# Patient Record
Sex: Male | Born: 1960 | Hispanic: Refuse to answer | Smoking: Current every day smoker
Health system: Southern US, Community
[De-identification: ages and names within clinical notes are randomized; demographics above are authoritative.]

## PROBLEM LIST (undated history)

## (undated) DIAGNOSIS — E119 Type 2 diabetes mellitus without complications: Secondary | ICD-10-CM

## (undated) DIAGNOSIS — I10 Essential (primary) hypertension: Secondary | ICD-10-CM

## (undated) DIAGNOSIS — N183 Chronic kidney disease, stage 3 unspecified: Secondary | ICD-10-CM

## (undated) DIAGNOSIS — F32A Depression, unspecified: Secondary | ICD-10-CM

## (undated) DIAGNOSIS — F329 Major depressive disorder, single episode, unspecified: Secondary | ICD-10-CM

## (undated) HISTORY — DX: Chronic kidney disease, stage 3 unspecified: N18.30

## (undated) HISTORY — DX: Chronic kidney disease, stage 3 (moderate): N18.3

---

## 2006-08-09 ENCOUNTER — Ambulatory Visit: Payer: Self-pay | Admitting: Nurse Practitioner

## 2006-09-27 ENCOUNTER — Ambulatory Visit: Payer: Self-pay | Admitting: Nurse Practitioner

## 2007-07-02 ENCOUNTER — Encounter (INDEPENDENT_AMBULATORY_CARE_PROVIDER_SITE_OTHER): Payer: Self-pay | Admitting: *Deleted

## 2007-07-14 ENCOUNTER — Ambulatory Visit: Payer: Self-pay | Admitting: Internal Medicine

## 2007-07-23 ENCOUNTER — Ambulatory Visit: Payer: Self-pay | Admitting: Nurse Practitioner

## 2007-08-19 ENCOUNTER — Emergency Department (HOSPITAL_COMMUNITY): Admission: EM | Admit: 2007-08-19 | Discharge: 2007-08-19 | Payer: Self-pay | Admitting: Emergency Medicine

## 2008-01-05 ENCOUNTER — Ambulatory Visit: Payer: Self-pay | Admitting: Internal Medicine

## 2008-03-27 ENCOUNTER — Emergency Department (HOSPITAL_COMMUNITY): Admission: EM | Admit: 2008-03-27 | Discharge: 2008-03-27 | Payer: Self-pay | Admitting: Emergency Medicine

## 2008-03-29 ENCOUNTER — Ambulatory Visit: Payer: Self-pay | Admitting: *Deleted

## 2008-09-03 ENCOUNTER — Emergency Department (HOSPITAL_COMMUNITY): Admission: EM | Admit: 2008-09-03 | Discharge: 2008-09-03 | Payer: Self-pay | Admitting: Emergency Medicine

## 2008-09-28 ENCOUNTER — Emergency Department (HOSPITAL_COMMUNITY): Admission: EM | Admit: 2008-09-28 | Discharge: 2008-09-28 | Payer: Self-pay | Admitting: Emergency Medicine

## 2008-10-11 ENCOUNTER — Emergency Department (HOSPITAL_COMMUNITY): Admission: EM | Admit: 2008-10-11 | Discharge: 2008-10-11 | Payer: Self-pay | Admitting: Emergency Medicine

## 2008-11-01 ENCOUNTER — Emergency Department (HOSPITAL_COMMUNITY): Admission: EM | Admit: 2008-11-01 | Discharge: 2008-11-01 | Payer: Self-pay | Admitting: Family Medicine

## 2008-11-01 ENCOUNTER — Emergency Department (HOSPITAL_COMMUNITY): Admission: EM | Admit: 2008-11-01 | Discharge: 2008-11-01 | Payer: Self-pay | Admitting: Emergency Medicine

## 2008-11-03 ENCOUNTER — Emergency Department (HOSPITAL_COMMUNITY): Admission: EM | Admit: 2008-11-03 | Discharge: 2008-11-03 | Payer: Self-pay | Admitting: Emergency Medicine

## 2008-12-01 ENCOUNTER — Emergency Department (HOSPITAL_COMMUNITY): Admission: EM | Admit: 2008-12-01 | Discharge: 2008-12-01 | Payer: Self-pay | Admitting: Emergency Medicine

## 2008-12-20 ENCOUNTER — Emergency Department (HOSPITAL_COMMUNITY): Admission: EM | Admit: 2008-12-20 | Discharge: 2008-12-20 | Payer: Self-pay | Admitting: Emergency Medicine

## 2009-01-04 ENCOUNTER — Emergency Department (HOSPITAL_COMMUNITY): Admission: EM | Admit: 2009-01-04 | Discharge: 2009-01-04 | Payer: Self-pay | Admitting: Emergency Medicine

## 2009-01-06 ENCOUNTER — Ambulatory Visit: Payer: Self-pay | Admitting: Internal Medicine

## 2009-01-06 ENCOUNTER — Encounter (INDEPENDENT_AMBULATORY_CARE_PROVIDER_SITE_OTHER): Payer: Self-pay | Admitting: Internal Medicine

## 2009-01-06 LAB — CONVERTED CEMR LAB
Barbiturate Quant, Ur: NEGATIVE
Benzodiazepines.: NEGATIVE
HCT: 47.8 % (ref 39.0–52.0)
Lymphocytes Relative: 28 % (ref 12–46)
MCV: 80.5 fL (ref 78.0–100.0)
Methadone: NEGATIVE
Microalb, Ur: 11.2 mg/dL — ABNORMAL HIGH (ref 0.00–1.89)
Monocytes Absolute: 0.7 10*3/uL (ref 0.1–1.0)
Neutro Abs: 5.3 10*3/uL (ref 1.7–7.7)
Neutrophils Relative %: 61 % (ref 43–77)
Platelets: 179 10*3/uL (ref 150–400)
Prolactin: 4.3 ng/mL (ref 2.1–17.1)
Propoxyphene: NEGATIVE
RDW: 14.3 % (ref 11.5–15.5)

## 2009-02-08 ENCOUNTER — Emergency Department (HOSPITAL_COMMUNITY): Admission: EM | Admit: 2009-02-08 | Discharge: 2009-02-09 | Payer: Self-pay | Admitting: *Deleted

## 2009-03-04 ENCOUNTER — Ambulatory Visit: Payer: Self-pay | Admitting: Family Medicine

## 2009-06-06 ENCOUNTER — Ambulatory Visit: Payer: Self-pay | Admitting: Family Medicine

## 2009-08-11 ENCOUNTER — Ambulatory Visit: Payer: Self-pay | Admitting: Internal Medicine

## 2009-12-08 ENCOUNTER — Emergency Department (HOSPITAL_COMMUNITY): Admission: EM | Admit: 2009-12-08 | Discharge: 2009-12-08 | Payer: Self-pay | Admitting: Emergency Medicine

## 2010-04-09 ENCOUNTER — Emergency Department (HOSPITAL_COMMUNITY): Admission: EM | Admit: 2010-04-09 | Discharge: 2010-04-09 | Payer: Self-pay | Admitting: Emergency Medicine

## 2010-12-31 LAB — DIFFERENTIAL
Basophils Absolute: 0.1 10*3/uL (ref 0.0–0.1)
Basophils Relative: 1 % (ref 0–1)
Lymphocytes Relative: 30 % (ref 12–46)
Lymphs Abs: 2.4 10*3/uL (ref 0.7–4.0)
Monocytes Absolute: 0.6 10*3/uL (ref 0.1–1.0)
Neutro Abs: 4.7 10*3/uL (ref 1.7–7.7)
Neutrophils Relative %: 59 % (ref 43–77)

## 2010-12-31 LAB — BASIC METABOLIC PANEL
Calcium: 8.8 mg/dL (ref 8.4–10.5)
GFR calc Af Amer: >60 mL/min (ref >60)
GFR calc non Af Amer: >60 mL/min (ref >60)
Potassium: 3.8 mEq/L (ref 3.5–5.1)
Sodium: 137 mEq/L (ref 135–145)

## 2010-12-31 LAB — CBC
HCT: 41.1 % (ref 39.0–52.0)
Hemoglobin: 13.6 g/dL (ref 13.0–17.0)
MCH: 26.4 pg (ref 26.0–34.0)
MCV: 79.8 fL (ref 78.0–100.0)
Platelets: 155 10*3/uL (ref 150–400)
RBC: 5.16 MIL/uL (ref 4.22–5.81)
WBC: 8 10*3/uL (ref 4.0–10.5)

## 2011-07-18 ENCOUNTER — Emergency Department (HOSPITAL_COMMUNITY)
Admission: EM | Admit: 2011-07-18 | Discharge: 2011-07-19 | Disposition: A | Discharge status: Other Acute Inpatient Hospital | Payer: Medicare Other | Attending: Emergency Medicine | Admitting: Emergency Medicine

## 2011-07-18 DIAGNOSIS — Z9119 Patient's noncompliance with other medical treatment and regimen: Secondary | ICD-10-CM | POA: Insufficient documentation

## 2011-07-18 DIAGNOSIS — R45851 Suicidal ideations: Secondary | ICD-10-CM | POA: Insufficient documentation

## 2011-07-18 DIAGNOSIS — F329 Major depressive disorder, single episode, unspecified: Secondary | ICD-10-CM | POA: Insufficient documentation

## 2011-07-18 DIAGNOSIS — I10 Essential (primary) hypertension: Secondary | ICD-10-CM | POA: Insufficient documentation

## 2011-07-18 DIAGNOSIS — Z91199 Patient's noncompliance with other medical treatment and regimen due to unspecified reason: Secondary | ICD-10-CM | POA: Insufficient documentation

## 2011-07-18 DIAGNOSIS — E1169 Type 2 diabetes mellitus with other specified complication: Secondary | ICD-10-CM | POA: Insufficient documentation

## 2011-07-18 DIAGNOSIS — F3289 Other specified depressive episodes: Secondary | ICD-10-CM | POA: Insufficient documentation

## 2011-07-18 LAB — URINALYSIS, ROUTINE W REFLEX MICROSCOPIC
Bilirubin Urine: NEGATIVE
Ketones, ur: NEGATIVE mg/dL
Leukocytes, UA: NEGATIVE
Nitrite: NEGATIVE
Protein, ur: >300 mg/dL — AB
pH: 6 (ref 5.0–8.0)

## 2011-07-18 LAB — DIFFERENTIAL
Basophils Absolute: 0 10*3/uL (ref 0.0–0.1)
Eosinophils Absolute: 0 10*3/uL (ref 0.0–0.7)
Eosinophils Relative: 0 % (ref 0–5)
Lymphocytes Relative: 11 % — ABNORMAL LOW (ref 12–46)
Monocytes Absolute: 0.8 10*3/uL (ref 0.1–1.0)
Neutro Abs: 11.5 10*3/uL — ABNORMAL HIGH (ref 1.7–7.7)

## 2011-07-18 LAB — GLUCOSE, CAPILLARY: Glucose-Capillary: 283 mg/dL — ABNORMAL HIGH (ref 70–99)

## 2011-07-18 LAB — POCT I-STAT, CHEM 8
Glucose, Bld: 273 — ABNORMAL HIGH
HCT: 52
Hemoglobin: 17.7 — ABNORMAL HIGH

## 2011-07-18 LAB — HEPATIC FUNCTION PANEL
ALT: 9 U/L (ref 0–53)
Alkaline Phosphatase: 86 U/L (ref 39–117)

## 2011-07-18 LAB — POCT CARDIAC MARKERS: CKMB, poc: <1 — ABNORMAL LOW

## 2011-07-18 LAB — CBC
MCH: 25.7 pg — ABNORMAL LOW (ref 26.0–34.0)
MCHC: 33.7 g/dL (ref 30.0–36.0)
MCV: 76.4 fL — ABNORMAL LOW (ref 78.0–100.0)
Platelets: 208 10*3/uL (ref 150–400)
WBC: 13.8 10*3/uL — ABNORMAL HIGH (ref 4.0–10.5)

## 2011-07-18 LAB — URINE MICROSCOPIC-ADD ON

## 2011-07-18 LAB — BASIC METABOLIC PANEL
CO2: 29 mEq/L (ref 19–32)
Calcium: 8.9 mg/dL (ref 8.4–10.5)
GFR calc non Af Amer: >90 mL/min (ref >90)
Glucose, Bld: 330 mg/dL — ABNORMAL HIGH (ref 70–99)

## 2011-07-18 LAB — RAPID URINE DRUG SCREEN, HOSP PERFORMED
Amphetamines: NOT DETECTED
Tetrahydrocannabinol: POSITIVE — AB

## 2011-07-18 LAB — ACETAMINOPHEN LEVEL: Acetaminophen (Tylenol), Serum: <15 ug/mL (ref 10–30)

## 2011-07-18 LAB — ETHANOL: Alcohol, Ethyl (B): <11 mg/dL (ref 0–11)

## 2011-07-19 LAB — GLUCOSE, CAPILLARY

## 2012-02-18 ENCOUNTER — Emergency Department (HOSPITAL_COMMUNITY)
Admission: EM | Admit: 2012-02-18 | Discharge: 2012-02-19 | Disposition: A | Discharge status: Other Acute Inpatient Hospital | Payer: Medicare Other | Source: Home / Self Care | Attending: Emergency Medicine | Admitting: Emergency Medicine

## 2012-02-18 ENCOUNTER — Encounter (HOSPITAL_COMMUNITY): Payer: Self-pay | Admitting: Adult Health

## 2012-02-18 DIAGNOSIS — R45851 Suicidal ideations: Secondary | ICD-10-CM

## 2012-02-18 DIAGNOSIS — F329 Major depressive disorder, single episode, unspecified: Secondary | ICD-10-CM

## 2012-02-18 DIAGNOSIS — F1910 Other psychoactive substance abuse, uncomplicated: Secondary | ICD-10-CM

## 2012-02-18 DIAGNOSIS — F17200 Nicotine dependence, unspecified, uncomplicated: Secondary | ICD-10-CM | POA: Insufficient documentation

## 2012-02-18 DIAGNOSIS — I10 Essential (primary) hypertension: Secondary | ICD-10-CM | POA: Insufficient documentation

## 2012-02-18 DIAGNOSIS — Z79899 Other long term (current) drug therapy: Secondary | ICD-10-CM | POA: Insufficient documentation

## 2012-02-18 DIAGNOSIS — F3289 Other specified depressive episodes: Secondary | ICD-10-CM | POA: Insufficient documentation

## 2012-02-18 HISTORY — DX: Major depressive disorder, single episode, unspecified: F32.9

## 2012-02-18 HISTORY — DX: Depression, unspecified: F32.A

## 2012-02-18 HISTORY — DX: Essential (primary) hypertension: I10

## 2012-02-18 LAB — CBC
Hemoglobin: 11.3 g/dL — ABNORMAL LOW (ref 13.0–17.0)
MCH: 26 pg (ref 26.0–34.0)
MCV: 78.8 fL (ref 78.0–100.0)
RBC: 4.34 MIL/uL (ref 4.22–5.81)
WBC: 10.7 10*3/uL — ABNORMAL HIGH (ref 4.0–10.5)

## 2012-02-18 LAB — RAPID URINE DRUG SCREEN, HOSP PERFORMED
Amphetamines: NOT DETECTED
Barbiturates: NOT DETECTED
Opiates: NOT DETECTED
Tetrahydrocannabinol: NOT DETECTED

## 2012-02-18 LAB — COMPREHENSIVE METABOLIC PANEL
ALT: 10 U/L (ref 0–53)
AST: 12 U/L (ref 0–37)
CO2: 30 mEq/L (ref 19–32)
Calcium: 9.3 mg/dL (ref 8.4–10.5)
Chloride: 101 mEq/L (ref 96–112)
GFR calc Af Amer: 72 mL/min — ABNORMAL LOW (ref >90)
GFR calc non Af Amer: 62 mL/min — ABNORMAL LOW (ref >90)
Glucose, Bld: 234 mg/dL — ABNORMAL HIGH (ref 70–99)
Sodium: 138 mEq/L (ref 135–145)
Total Bilirubin: 0.1 mg/dL — ABNORMAL LOW (ref 0.3–1.2)

## 2012-02-18 LAB — GLUCOSE, CAPILLARY: Glucose-Capillary: 252 mg/dL — ABNORMAL HIGH (ref 70–99)

## 2012-02-18 MED ORDER — ZOLPIDEM TARTRATE 5 MG PO TABS
5.0000 mg | ORAL_TABLET | Freq: Every evening | ORAL | Status: DC | PRN
  Administered 2012-02-18: 5 mg via ORAL
  Filled 2012-02-18: qty 1

## 2012-02-18 MED ORDER — ALUM & MAG HYDROXIDE-SIMETH 200-200-20 MG/5ML PO SUSP: 30.0000 mL | ORAL | Status: DC | PRN

## 2012-02-18 MED ORDER — ONDANSETRON HCL 8 MG PO TABS: 4.0000 mg | ORAL_TABLET | Freq: Three times a day (TID) | ORAL | Status: DC | PRN

## 2012-02-18 MED ORDER — METFORMIN HCL 500 MG PO TABS
500.0000 mg | ORAL_TABLET | Freq: Two times a day (BID) | ORAL | Status: DC
  Administered 2012-02-18 – 2012-02-19 (×2): 500 mg via ORAL
  Filled 2012-02-18 (×4): qty 1

## 2012-02-18 NOTE — ED Notes (Signed)
House coverage notified of SI pt and Charge RN notified

## 2012-02-18 NOTE — ED Notes (Signed)
CBG 252 

## 2012-02-18 NOTE — ED Notes (Signed)
ACT team at bedside.  
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
## 2012-02-18 NOTE — ED Provider Notes (Signed)
History     CSN: [id number]  Arrival date & time 02/18/12  1549   First MD Initiated Contact with Patient 02/18/12 1609      Chief Complaint  Patient presents with  . Medical Clearance    (Consider location/radiation/quality/duration/timing/severity/associated sxs/prior treatment) HPI Pt with history of depression and substance abuse reports 2 weeks of worsening depression and suicidal ideation with plan to overdose or jump in front of a car. He admits to occasional EtOH and crack cocaine use. Denies any overdose on his medications. He has chronic leg pain from neuropathy, but no acute somatic symptoms. No CP, SOB, N/V/D.   Past Medical History  Diagnosis Date  . Depression   . Hypertension     History reviewed. No pertinent past surgical history.  History reviewed. No pertinent family history.  History  Substance Use Topics  . Smoking status: Current Everyday Smoker  . Smokeless tobacco: Not on file  . Alcohol Use: Yes      Review of Systems All other systems reviewed and are negative except as noted in HPI.   Allergies  Review of patient's allergies indicates no known allergies.  Home Medications   Current Outpatient Rx  Name Route Sig Dispense Refill  . AMLODIPINE BESYLATE 10 MG PO TABS Oral Take 10 mg by mouth daily.    Marland Kitchen FLUOXETINE HCL 20 MG PO TABS Oral Take 60 mg by mouth every morning.    Marland Kitchen GABAPENTIN 400 MG PO CAPS Oral Take 400 mg by mouth 3 (three) times daily.    Marland Kitchen HYDROCHLOROTHIAZIDE 25 MG PO TABS Oral Take 25 mg by mouth every morning.    Marland Kitchen METFORMIN HCL 500 MG PO TABS Oral Take 500 mg by mouth 2 (two) times daily with a meal.    . OXYCODONE HCL ER 30 MG PO TB12 Oral Take 30 mg by mouth every 12 (twelve) hours.    Marland Kitchen QUETIAPINE FUMARATE 300 MG PO TABS Oral Take 300 mg by mouth at bedtime.    Marland Kitchen VITAMIN D (ERGOCALCIFEROL) 50000 UNITS PO CAPS Oral Take 50,000 Units by mouth every 7 (seven) days. Takes on Tuesdays.    Marland Kitchen ZOLPIDEM TARTRATE 10 MG PO TABS  Oral Take 10 mg by mouth at bedtime as needed. For sleep.      BP 162/96  Pulse 84  Temp(Src) 98.7 F (37.1 C) (Oral)  Resp 16  SpO2 95%  Physical Exam  Nursing note and vitals reviewed. Constitutional: He is oriented to person, place, and time. He appears well-developed and well-nourished.  HENT:  Head: Normocephalic and atraumatic.  Eyes: EOM are normal. Pupils are equal, round, and reactive to light.  Neck: Normal range of motion. Neck supple.  Cardiovascular: Normal rate, normal heart sounds and intact distal pulses.   Pulmonary/Chest: Effort normal and breath sounds normal.  Abdominal: Bowel sounds are normal. He exhibits no distension. There is no tenderness.  Musculoskeletal: Normal range of motion. He exhibits no edema and no tenderness.  Neurological: He is alert and oriented to person, place, and time. He has normal strength. No cranial nerve deficit or sensory deficit.  Skin: Skin is warm and dry. No rash noted.  Psychiatric:       Depressed mood, flat affect    ED Course  Procedures (including critical care time)   Labs Reviewed  CBC  COMPREHENSIVE METABOLIC PANEL  ETHANOL  ACETAMINOPHEN LEVEL  URINE RAPID DRUG SCREEN (HOSP PERFORMED)   No results found.   No diagnosis found.  MDM  Plan lab eval for medical clearance and then ACT team referral for placement.    PT seen by ACT, anticipate [facility] placement. Metformin ordered for hyperglycemia, although patient is unsure of his dose. Care signed out to Dr. Zenia Resides at the change of shift.   Kameryn Davern B. Karle Starch, MD 02/18/12 2310

## 2012-02-18 NOTE — ED Notes (Signed)
Began feeling "like I want to be off this earth and not even be in it. I have been here before. I did have a plan to kill myself with medicines that I take. I found a friend dead in the bathroom and I have been kind of down since then. I also have a crack problem" Last time used was 3 days ago, last drink three days ago.

## 2012-02-18 NOTE — ED Notes (Signed)
Pt wanded by security. Has 4 belonging bags and one khaki coat with a hood. $13.00 and 2 credit cards.

## 2012-02-19 ENCOUNTER — Inpatient Hospital Stay (HOSPITAL_COMMUNITY)
Admission: EM | Admit: 2012-02-19 | Discharge: 2012-02-25 | DRG: 885 | Disposition: A | Discharge status: Home / Self Care | Payer: Medicare Other | Attending: Psychiatry | Admitting: Psychiatry

## 2012-02-19 DIAGNOSIS — F251 Schizoaffective disorder, depressive type: Secondary | ICD-10-CM | POA: Diagnosis present

## 2012-02-19 DIAGNOSIS — F259 Schizoaffective disorder, unspecified: Principal | ICD-10-CM | POA: Diagnosis present

## 2012-02-19 DIAGNOSIS — F1210 Cannabis abuse, uncomplicated: Secondary | ICD-10-CM | POA: Diagnosis present

## 2012-02-19 DIAGNOSIS — F1420 Cocaine dependence, uncomplicated: Secondary | ICD-10-CM | POA: Diagnosis present

## 2012-02-19 DIAGNOSIS — Z794 Long term (current) use of insulin: Secondary | ICD-10-CM

## 2012-02-19 DIAGNOSIS — I10 Essential (primary) hypertension: Secondary | ICD-10-CM | POA: Diagnosis present

## 2012-02-19 DIAGNOSIS — F17200 Nicotine dependence, unspecified, uncomplicated: Secondary | ICD-10-CM | POA: Diagnosis present

## 2012-02-19 DIAGNOSIS — R45851 Suicidal ideations: Secondary | ICD-10-CM

## 2012-02-19 DIAGNOSIS — Z79899 Other long term (current) drug therapy: Secondary | ICD-10-CM

## 2012-02-19 MED ORDER — ACETAMINOPHEN 325 MG PO TABS: 650.0000 mg | ORAL_TABLET | Freq: Four times a day (QID) | ORAL | Status: DC | PRN

## 2012-02-19 MED ORDER — CLONIDINE HCL 0.1 MG PO TABS
0.1000 mg | ORAL_TABLET | Freq: Once | ORAL | Status: AC
  Administered 2012-02-19: 0.1 mg via ORAL
  Filled 2012-02-19: qty 1

## 2012-02-19 MED ORDER — MAGNESIUM HYDROXIDE 400 MG/5ML PO SUSP: 30.0000 mL | Freq: Every day | ORAL | Status: DC | PRN

## 2012-02-19 MED ORDER — IBUPROFEN 800 MG PO TABS
800.0000 mg | ORAL_TABLET | Freq: Once | ORAL | Status: AC
  Administered 2012-02-19: 800 mg via ORAL
  Filled 2012-02-19: qty 1

## 2012-02-19 MED ORDER — ALUM & MAG HYDROXIDE-SIMETH 200-200-20 MG/5ML PO SUSP: 30.0000 mL | ORAL | Status: DC | PRN

## 2012-02-19 MED ORDER — HYDROCHLOROTHIAZIDE 25 MG PO TABS
25.0000 mg | ORAL_TABLET | ORAL | Status: AC
  Administered 2012-02-19: 25 mg via ORAL
  Filled 2012-02-19: qty 1

## 2012-02-19 NOTE — ED Notes (Signed)
Note sent to pharmacy to send patient's HCTZ.

## 2012-02-19 NOTE — ED Notes (Signed)
Patient's belongings were given to Security

## 2012-02-19 NOTE — ED Notes (Signed)
Pt was escorted by [employer] and Waunita Schooner EMT to [facility]

## 2012-02-19 NOTE — ED Provider Notes (Signed)
The patient is here requesting treatment for substance abuse problems.  Filed Vitals:   02/19/12 0552  BP: 136/81  Pulse: 71  Temp: 98.4 F (36.9 C)  Resp: 18   Heart: Regular in rhythm Lungs: Clear to auscultation  The patient's blood pressure has improved. Her health wanted to make sure that he had a stable blood pressure before he would be admitted at the facility. We will continue to monitor  Kathalene Frames, MD 02/19/12 [phone]

## 2012-02-19 NOTE — BH Assessment (Signed)
Assessment Note  Update:  Received call from [facility] stating pt accepted to [facility] to Dr. Pollie Friar by Dr. Dell Ponto to bed 304-2.  Updated EDP Tomi Bamberger and ED staff.  Completed support paperwork.  Updated assessment disposition, completed assessment notification and faxed to [facility] to log.  ED staff to arrange transport to [facility] via security, as pt is voluntary. Disposition:  Disposition Disposition of Patient: Inpatient treatment program Type of inpatient treatment program: Adult (Pt accepted [facility])  On Site Evaluation by:   Reviewed with Physician:  Audry Riles, Lorayne Bender 02/19/2012 1:21 PM

## 2012-02-19 NOTE — Progress Notes (Signed)
Patient ID: Jose Conway, male   DOB: 04/23/1961, 51 y.o.   MRN: [id number] Pt states he has been suffering from depression for the last 20 years. Pt stated that he just feel he is not where he needs to be in life and has not accomplished what he thought he would have by now. Pt states he has no kids and is separated from his wife. Pt rates depression at a 10 and anxiety at a 8. Pt states when his depression gets high that is when he starts having suicidal thoughts. Pt states he is currently having suicidal ideations but is able to contract. Pt would also like medication management feels that what he is on is not working. Pt would also like to go from here into a long term treatment facility. Pt is pleasant and cooperative.

## 2012-02-19 NOTE — Progress Notes (Signed)
IN room preparing for bed on approach. Has been visible in milieu but is generally withdrawn to self. Appears flat and depressed. Calm and cooperative with assessment. No acute distress noted. States he feels like he is adjusting to milieu. Has attended groups and said they were "OK". Asked Probation officer for something for sleep. However, there were no scheduled or prn meds available. Encouraged to attempt to lay down and see if he can get to sleep naturally. If after an hour he is still awake, Probation officer assured him he would call the on call practitioner to see if he could have anything. Pt agreed to this and states he will try. Support and encouragement provided. Offered no additional questions or concerns. POC for the shift reviewed and understanding verbalized. Safety has been maintained with Q15 minute observation. Will continue current POC.

## 2012-02-19 NOTE — Tx Team (Signed)
Initial Interdisciplinary Treatment Plan  PATIENT STRENGTHS: (choose at least two) Ability for insight Average or above average intelligence Capable of independent living General fund of knowledge  PATIENT STRESSORS: Health problems   PROBLEM LIST: Problem List/Patient Goals Date to be addressed Date deferred Reason deferred Estimated date of resolution  Depression 02/19/12     Anxiety 02/19/12     Suicidal Ideation 02/19/12     Medication Management 02/19/12                                    DISCHARGE CRITERIA:  Ability to meet basic life and health needs Improved stabilization in mood, thinking, and/or behavior Motivation to continue treatment in a less acute level of care Reduction of life-threatening or endangering symptoms to within safe limits  PRELIMINARY DISCHARGE PLAN: Attend aftercare/continuing care group Outpatient therapy Return to previous living arrangement  PATIENT/FAMIILY INVOLVEMENT: This treatment plan has been presented to and reviewed with the patient, Jose Conway, and/or family member.  The patient and family have been given the opportunity to ask questions and make suggestions.  Beverly Sessions Violon 02/19/2012, 8:10 PM

## 2012-02-20 DIAGNOSIS — F251 Schizoaffective disorder, depressive type: Secondary | ICD-10-CM | POA: Diagnosis present

## 2012-02-20 DIAGNOSIS — F259 Schizoaffective disorder, unspecified: Principal | ICD-10-CM

## 2012-02-20 DIAGNOSIS — F1420 Cocaine dependence, uncomplicated: Secondary | ICD-10-CM | POA: Diagnosis present

## 2012-02-20 LAB — GLUCOSE, CAPILLARY

## 2012-02-20 MED ORDER — PRAZOSIN HCL 1 MG PO CAPS
1.0000 mg | ORAL_CAPSULE | Freq: Every evening | ORAL | Status: DC | PRN
  Administered 2012-02-20 (×2): 1 mg via ORAL
  Filled 2012-02-20 (×5): qty 1

## 2012-02-20 MED ORDER — GABAPENTIN 400 MG PO CAPS
400.0000 mg | ORAL_CAPSULE | Freq: Three times a day (TID) | ORAL | Status: DC
  Administered 2012-02-20: 400 mg via ORAL
  Filled 2012-02-20 (×6): qty 1

## 2012-02-20 MED ORDER — GABAPENTIN 300 MG PO CAPS
600.0000 mg | ORAL_CAPSULE | Freq: Four times a day (QID) | ORAL | Status: DC
  Administered 2012-02-20 – 2012-02-25 (×19): 600 mg via ORAL
  Filled 2012-02-20 (×26): qty 2

## 2012-02-20 MED ORDER — AMLODIPINE BESYLATE 10 MG PO TABS: 10.0000 mg | ORAL_TABLET | Freq: Every day | ORAL | Status: DC

## 2012-02-20 MED ORDER — AMLODIPINE BESYLATE 10 MG PO TABS
10.0000 mg | ORAL_TABLET | Freq: Every day | ORAL | Status: DC
  Administered 2012-02-20 – 2012-02-25 (×6): 10 mg via ORAL
  Filled 2012-02-20 (×8): qty 1

## 2012-02-20 MED ORDER — METFORMIN HCL 500 MG PO TABS
1000.0000 mg | ORAL_TABLET | Freq: Two times a day (BID) | ORAL | Status: DC
  Administered 2012-02-20 – 2012-02-25 (×10): 1000 mg via ORAL
  Filled 2012-02-20 (×14): qty 2

## 2012-02-20 MED ORDER — VITAMIN D (ERGOCALCIFEROL) 1.25 MG (50000 UNIT) PO CAPS
50000.0000 [IU] | ORAL_CAPSULE | ORAL | Status: DC
  Filled 2012-02-20 (×2): qty 1

## 2012-02-20 MED ORDER — DULOXETINE HCL 60 MG PO CPEP
60.0000 mg | ORAL_CAPSULE | Freq: Every day | ORAL | Status: DC
  Administered 2012-02-22 – 2012-02-25 (×4): 60 mg via ORAL
  Filled 2012-02-20 (×8): qty 1

## 2012-02-20 MED ORDER — INSULIN ASPART 100 UNIT/ML ~~LOC~~ SOLN
4.0000 [IU] | Freq: Three times a day (TID) | SUBCUTANEOUS | Status: DC
  Administered 2012-02-20 – 2012-02-25 (×15): 4 [IU] via SUBCUTANEOUS

## 2012-02-20 MED ORDER — INSULIN ASPART 100 UNIT/ML ~~LOC~~ SOLN: 0.0000 [IU] | Freq: Every day | SUBCUTANEOUS | Status: DC

## 2012-02-20 MED ORDER — DULOXETINE HCL 30 MG PO CPEP
30.0000 mg | ORAL_CAPSULE | Freq: Every day | ORAL | Status: AC
  Administered 2012-02-20 – 2012-02-21 (×2): 30 mg via ORAL
  Filled 2012-02-20 (×2): qty 1

## 2012-02-20 MED ORDER — INSULIN ASPART 100 UNIT/ML ~~LOC~~ SOLN
0.0000 [IU] | Freq: Three times a day (TID) | SUBCUTANEOUS | Status: DC
  Administered 2012-02-20: 8 [IU] via SUBCUTANEOUS
  Administered 2012-02-21: 5 [IU] via SUBCUTANEOUS
  Administered 2012-02-21: 3 [IU] via SUBCUTANEOUS
  Administered 2012-02-21: 5 [IU] via SUBCUTANEOUS
  Administered 2012-02-22 (×2): 3 [IU] via SUBCUTANEOUS
  Administered 2012-02-22: 5 [IU] via SUBCUTANEOUS
  Administered 2012-02-23 (×2): 3 [IU] via SUBCUTANEOUS
  Administered 2012-02-23 – 2012-02-24 (×2): 2 [IU] via SUBCUTANEOUS
  Administered 2012-02-24 (×2): 3 [IU] via SUBCUTANEOUS
  Administered 2012-02-25: 8 [IU] via SUBCUTANEOUS
  Administered 2012-02-25: 2 [IU] via SUBCUTANEOUS

## 2012-02-20 NOTE — Progress Notes (Signed)
[facility] Group Notes: (Counselor/Nursing/MHT/Case Management/Adjunct) 02/20/2012   @  11:00 Emotion Regulation   Type of Therapy:  Group Therapy  Participation Level:  None  Participation Quality: Attentive     Affect:  Blunted  Cognitive:  Appropriate  Insight:  None  Engagement in Group: None  Engagement in Therapy:  None  Modes of Intervention:  Support and Exploration  Summary of Progress/Problems:   Jose Conway did not engage in group activity or discussion.  Jose Conway 02/20/2012 2:25 PM

## 2012-02-20 NOTE — H&P (Signed)
Medical/psychiatric screening examination/treatment/procedure(s) were performed by non-physician practitioner and as supervising physician I was immediately available for consultation/collaboration.  I have seen and examined this patient and agree the major elements of this evaluation.  

## 2012-02-20 NOTE — Progress Notes (Signed)
D:  Pt attended AA group this evening.  He was oriented to unit and scheduling.  A:  Encouraged pt interactions with peers and group attendance.  R:  Pt is safe. Carlye Grippe, Vicent Febles L, MHT/NS

## 2012-02-20 NOTE — Progress Notes (Signed)
[facility] Group Notes: (Counselor/Nursing/MHT/Case Management/Adjunct) 02/20/2012   @1 :15pm Coping with Anxiety:  Using Self-Talk, Breathing Techniques, Muscle Relaxation and Other Tactics  Type of Therapy:  Group Therapy  Participation Level: Limited   Participation Quality:  Attentive  Affect:  Blunted  Cognitive:  Appropriate  Insight:  None  Engagement in Group: Limited  Engagement in Therapy:  None  Modes of Intervention:  Support and Exploration  Summary of Progress/Problems: Jose Conway engaged in breathing and other relaxation techniques, but not in group discussion.    [name] 02/20/2012 4:28 PM

## 2012-02-20 NOTE — Progress Notes (Addendum)
[facility] MD Progress Note  02/20/2012 3:06 PM  Diagnosis:  Axis I: Schizoaffective Disorder and Cocaone Dependence  ADL's:  Intact  Sleep: Poor  Appetite:  Good  Suicidal Ideation:  Pt has some suicidal thoughts, but contracts for safety. Homicidal Ideation:  Denies adamantly any homicidal thoughts.  Mental Status Examination/Evaluation: Objective:  Appearance: Casual  Eye Contact::  Good  Speech:  Clear and Coherent  Volume:  Normal  Mood:  Anxious, Depressed and Worthless  Affect:  Blunt  Thought Process:  Coherent  Orientation:  Full  Thought Content:  Hallucinations: Auditory Visual  Suicidal Thoughts:  Yes.  without intent/plan  Homicidal Thoughts:  No  Memory:  Immediate;   Fair  Judgement:  Impaired  Insight:  Lacking  Psychomotor Activity:  Normal  Concentration:  Fair  Recall:  Fair  Akathisia:  No  Handed:  Right  AIMS (if indicated):     Assets:  Communication Skills Desire for Improvement  Sleep:  Number of Hours: 6    Vital Signs:Blood pressure 128/78, pulse 74, temperature 97.5 F (36.4 C), temperature source Oral, resp. rate 20, height 5\' 10"  (1.778 m), weight 97.07 kg (214 lb). Current Medications: Current Facility-Administered Medications  Medication Dose Route Frequency Provider Last Rate Last Dose  . acetaminophen (TYLENOL) tablet 650 mg  650 mg Oral Q6H PRN Nena Polio, PA-C      . alum & mag hydroxide-simeth (MAALOX/MYLANTA) 200-200-20 MG/5ML suspension 30 mL  30 mL Oral Q4H PRN Nena Polio, PA-C      . amLODipine (NORVASC) tablet 10 mg  10 mg Oral Daily Encarnacion Slates, NP   10 mg at 02/20/12 1408  . DULoxetine (CYMBALTA) DR capsule 30 mg  30 mg Oral Daily Darrol Jump, MD       Followed by  . DULoxetine (CYMBALTA) DR capsule 60 mg  60 mg Oral Daily Darrol Jump, MD      . gabapentin (NEURONTIN) capsule 600 mg  600 mg Oral QID Darrol Jump, MD      . insulin aspart (novoLOG) injection 0-15 Units  0-15 Units Subcutaneous TID WC Encarnacion Slates, NP      . insulin aspart (novoLOG) injection 0-5 Units  0-5 Units Subcutaneous QHS Encarnacion Slates, NP      . insulin aspart (novoLOG) injection 4 Units  4 Units Subcutaneous TID WC Encarnacion Slates, NP      . magnesium hydroxide (MILK OF MAGNESIA) suspension 30 mL  30 mL Oral Daily PRN Nena Polio, PA-C      . metFORMIN (GLUCOPHAGE) tablet 1,000 mg  1,000 mg Oral BID WC Encarnacion Slates, NP      . prazosin (MINIPRESS) capsule 1 mg  1 mg Oral QHS,MR X 1 Darrol Jump, MD      . Vitamin D (Ergocalciferol) (DRISDOL) capsule 50,000 Units  50,000 Units Oral Q7 days Encarnacion Slates, NP      . DISCONTD: amLODipine (NORVASC) tablet 10 mg  10 mg Oral Daily Encarnacion Slates, NP      . DISCONTD: gabapentin (NEURONTIN) capsule 400 mg  400 mg Oral TID Encarnacion Slates, NP   400 mg at 02/20/12 1408    Lab Results:  Results for orders placed during the hospital encounter of 02/18/12 (from the past 48 hour(s))  CBC     Status: Abnormal   Collection Time   02/18/12  4:12 PM      Component Value Range Comment  WBC 10.7 (*) 4.0 - 10.5 (K/uL)    RBC 4.34  4.22 - 5.81 (MIL/uL)    Hemoglobin 11.3 (*) 13.0 - 17.0 (g/dL)    HCT 34.2 (*) 39.0 - 52.0 (%)    MCV 78.8  78.0 - 100.0 (fL)    MCH 26.0  26.0 - 34.0 (pg)    MCHC 33.0  30.0 - 36.0 (g/dL)    RDW 13.3  11.5 - 15.5 (%)    Platelets 227  150 - 400 (K/uL)   COMPREHENSIVE METABOLIC PANEL     Status: Abnormal   Collection Time   02/18/12  4:12 PM      Component Value Range Comment   Sodium 138  135 - 145 (mEq/L)    Potassium 4.2  3.5 - 5.1 (mEq/L)    Chloride 101  96 - 112 (mEq/L)    CO2 30  19 - 32 (mEq/L)    Glucose, Bld 234 (*) 70 - 99 (mg/dL)    BUN 19  6 - 23 (mg/dL)    Creatinine, Ser 1.32  0.50 - 1.35 (mg/dL)    Calcium 9.3  8.4 - 10.5 (mg/dL)    Total Protein 7.2  6.0 - 8.3 (g/dL)    Albumin 3.3 (*) 3.5 - 5.2 (g/dL)    AST 12  0 - 37 (U/L)    ALT 10  0 - 53 (U/L)    Alkaline Phosphatase 79  39 - 117 (U/L)    Total Bilirubin 0.1 (*) 0.3 - 1.2  (mg/dL)    GFR calc non Af Amer 62 (*) >90 (mL/min)    GFR calc Af Amer 72 (*) >90 (mL/min)   ETHANOL     Status: Normal   Collection Time   02/18/12  4:12 PM      Component Value Range Comment   Alcohol, Ethyl (B) <11  0 - 11 (mg/dL)   ACETAMINOPHEN LEVEL     Status: Normal   Collection Time   02/18/12  4:12 PM      Component Value Range Comment   Acetaminophen (Tylenol), Serum <15.0  10 - 30 (ug/mL)   URINE RAPID DRUG SCREEN (HOSP PERFORMED)     Status: Abnormal   Collection Time   02/18/12  5:58 PM      Component Value Range Comment   Opiates NONE DETECTED  NONE DETECTED     Cocaine POSITIVE (*) NONE DETECTED     Benzodiazepines NONE DETECTED  NONE DETECTED     Amphetamines NONE DETECTED  NONE DETECTED     Tetrahydrocannabinol NONE DETECTED  NONE DETECTED     Barbiturates NONE DETECTED  NONE DETECTED    GLUCOSE, CAPILLARY     Status: Abnormal   Collection Time   02/18/12 10:51 PM      Component Value Range Comment   Glucose-Capillary 252 (*) 70 - 99 (mg/dL)    Comment 1 Notify RN      Comment 2 Documented in Chart       Physical Findings: AIMS:  , ,  ,  ,    CIWA:    COWS:     Treatment Plan Summary: Daily contact with patient to assess and evaluate symptoms and progress in treatment Medication management  Plan: We will admit the patient for crisis stabilization and treatment. I talked to pt about starting Cymbalta for depression and neuropathic pain and increase Neurontin for anxiety and nueropathic pain.  Stay off Oxycotin which is not that good for neuropathic pain. Start  12 Step meetings. I explained the risks and benefits of medication in detail.   Airyana Sprunger 02/20/2012, 3:06 PM
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
## 2012-02-20 NOTE — Progress Notes (Signed)
02/20/2012         Time: 1500      Group Topic/Focus: The focus of this group is on discussing various styles of communication and communicating assertively using 'I' (feeling) statements.  Participation Level: None  Participation Quality: Resistant  Affect: Irritable  Cognitive: Approprite  Additional Comments: When RT announced group, patient yelled "no." Patient sat in the dayroom and ate his snack, but left as soon as he was done and did not participate in the activity.  Labron Bloodgood 02/20/2012 3:46 PM

## 2012-02-20 NOTE — H&P (Signed)
Psychiatric Admission Assessment Adult  Patient Identification:  Jose Conway  Date of Evaluation:  02/20/2012  Chief Complaint:  MDD  History of Present Illness: This is a 51 year old African-American male, admitted from the [facility] ED with complaints of suicidal ideations and plans to jump in front of a moving truck, get crushed and or overdose on his medications. Patient reports, "I came to this hospital because of my depression, suicidal thoughts/plans and a little bit of drug addiction. I have been doing some drugs lately. I use crack cocaine. I feel very depressed because I am taking too much and too many pills. I don't know why I have to take too many pills to stay healthy. Why can't I just have one pill to take that will take care of my problems?  I have diabetes and high blood pressure. The diabetes I have had for 10 years and it is not getting any better. 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 My legs, feet and fingers tingle, get numb and hurt all the time.  I am going through some painful situation in my life. I don't get to do anything that I want, the way I want it and when I want it done. I am separated from my wife for 2 years and half years, but she is still stalking me. She embarrasses me in front of people. I feel miserable. That is why I keep getting more and more depressed. I am homeless, just of recent. I met a friend  who is going through some tough times as well. We talked to each other, console and support each other. He does not have any family members I think. As we became good friends, I got realize that he feels alone in this world.  I check and made sure that he was doing well from time to time. So, I went to his house to check on him few weeks ago. I found him already dead. He fell and hit his head on the toilet, few days after being discharged from the [facility]. I feel very sad".  Mood Symptoms:  Hopelessness, Past 2  Weeks, Sadness, SI,  Depression Symptoms:  depressed mood, feelings of worthlessness/guilt, suicidal thoughts with specific plan,  (Hypo) Manic Symptoms:  Irritable Mood,  Anxiety Symptoms:  Excessive Worry,  Psychotic Symptoms:  Hallucinations: Auditory Visual sometimes.  PTSD Symptoms: Had a traumatic exposure:  "I recently found my friend dead in his house"  Past Psychiatric History: Diagnosis: Schizoaffective disorder, PTSD  Hospitalizations: [facility]  Outpatient Care: [facility]  Substance Abuse Care: None reported  Self-Mutilation: None reported  Suicidal Attempts: "Yes, a long time ago by overdose"  Violent Behaviors: None reported   Past Medical History:   Past Medical History  Diagnosis Date  . Depression   . Hypertension      Allergies:  No Known Allergies   PTA Medications: Prescriptions prior to admission  Medication Sig Dispense Refill  . amLODipine (NORVASC) 10 MG tablet Take 10 mg by mouth every morning.       Marland Kitchen FLUoxetine (PROZAC) 20 MG tablet Take 60 mg by mouth every morning.      . gabapentin (NEURONTIN) 400 MG capsule Take 400 mg by mouth 3 (three) times daily.      . hydrochlorothiazide (HYDRODIURIL) 25 MG tablet Take 25 mg by mouth at bedtime. Patient states his PCP advised him to take it qhs because he has a photosensitivity reaction when  he takes it qam.      . HYDROcodone-acetaminophen (LORTAB) 10-500 MG per tablet Take 1 tablet by mouth 2 (two) times daily as needed.      . metFORMIN (GLUCOPHAGE) 500 MG tablet Take 1,000 mg by mouth 2 (two) times daily with a meal.       . oxycodone (OXYCONTIN) 30 MG TB12 Take 30 mg by mouth every 12 (twelve) hours.      Marland Kitchen QUEtiapine (SEROQUEL) 300 MG tablet Take 300 mg by mouth at bedtime.      . Vitamin D, Ergocalciferol, (DRISDOL) 50000 UNITS CAPS Take 50,000 Units by mouth every 7 (seven) days. Takes on Tuesday mornings.      Marland Kitchen zolpidem (AMBIEN) 10 MG tablet Take 10 mg by mouth at bedtime as needed. For sleep.         Previous Psychotropic Medications:  Medication/Dose  Metformin 100 mg bid  Norvasc 10 mg daily  Vitamin d 50,000 units Q weekly  Neurontin 400 mg tid         Substance Abuse History in the last 12 months: Substance Age of 1st Use Last Use Amount Specific Type  Nicotine 17 Prior to hosp 8-10 cigarettes   Cigarettes  Alcohol 17 "I drank 5 days ago"  Beer  Cannabis 17 "I smoke occasionally" 1-2 joints at a time Marijuana  Opiates Denies use     Cocaine 36 "I use frequently"  Crack  Methamphetamines Denies use     LSD Denies use     Ecstasy Denies use     Benzodiazepines Denies use     Caffeine      Inhalants      Others:                         Consequences of Substance Abuse: Medical Consequences:  Liver damage Legal Consequences:  Arrests, jail time Family Consequences:  family discord  Social History: Current Place of Residence: [place], [place]    Place of Birth: [place], [place]  Family Members: "I am separated"  Marital Status:  Separated  Children: 0  Sons:0  Daughters: 0  Relationships: "I'm separated"  Education:  [employer] fund[employer] Problems/Performance: None reported  Religious Beliefs/Practices: None reported  History of Abuse (Emotional/Phsycial/Sexual): None reported  Occupational Experiences: Disabled  Military History:  None.  Legal History: None reported  Hobbies/Interests: None reported  Family History:  Depression: Mother  Mental Status Examination/Evaluation: Objective:  Appearance: Casual  Eye Contact::  Fair  Speech:  Clear and Coherent  Volume:  Normal  Mood:  Depressed, Hopeless and Worthless  Affect:  Depressed and Flat  Thought Process:  Coherent  Orientation:  Full  Thought Content:  Rumination  Suicidal Thoughts:  Yes.  with intent/plan, able to contract for safety  Homicidal Thoughts:  No  Memory:  Immediate;   Good Recent;   Good Remote;   Good  Judgement:  Poor  Insight:  Fair  Psychomotor  Activity:  Normal  Concentration:  Good  Recall:  Good  Akathisia:  No  Handed:  Right  AIMS (if indicated):     Assets:  Desire for Improvement  Sleep:  Number of Hours: 6     Laboratory/X-Ray: None Psychological Evaluation(s)      Assessment:    AXIS I:  Schizoaffective Disorder AXIS II:  Deferred AXIS III:   Past Medical History  Diagnosis Date  . Depression   . Hypertension    AXIS IV:  economic problems, housing problems, occupational problems, other psychosocial or environmental problems and problems related to social environment AXIS V:  11-20 some danger of hurting self or others possible OR occasionally fails to maintain minimal personal hygiene OR gross impairment in communication  Treatment Plan/Recommendations: Admit for safety and stabilization. Review and reinstate any pertinent home medications for other health issues. Order diabetic control and monitoring regimen. Obtain Hemoglobin A1C.  Treatment Plan Summary: Daily contact with patient to assess and evaluate symptoms and progress in treatment Medication management   Current Medications:  Current Facility-Administered Medications  Medication Dose Route Frequency Provider Last Rate Last Dose  . acetaminophen (TYLENOL) tablet 650 mg  650 mg Oral Q6H PRN Nena Polio, PA-C      . alum & mag hydroxide-simeth (MAALOX/MYLANTA) 200-200-20 MG/5ML suspension 30 mL  30 mL Oral Q4H PRN Nena Polio, PA-C      . magnesium hydroxide (MILK OF MAGNESIA) suspension 30 mL  30 mL Oral Daily PRN Nena Polio, PA-C       Facility-Administered Medications Ordered in Other Encounters  Medication Dose Route Frequency Provider Last Rate Last Dose  . DISCONTD: alum & mag hydroxide-simeth (MAALOX/MYLANTA) 200-200-20 MG/5ML suspension 30 mL  30 mL Oral PRN Charles B. Karle [facility], MD      . DISCONTD: metFORMIN (GLUCOPHAGE) tablet 500 mg  500 mg Oral BID WC Charles B. Karle Starch, MD   500 mg at 02/19/12 0831  . DISCONTD: ondansetron  (ZOFRAN) tablet 4 mg  4 mg Oral Q8H PRN Charles B. Karle Starch, MD      . DISCONTD: zolpidem (AMBIEN) tablet 5 mg  5 mg Oral QHS PRN Charles B. Karle [facility]arch, MD   5 mg at 02/18/12 2330    Observation Level/Precautions:  Q 15 minutes checks for safety  Laboratory:  Reviewed ED lab findings on file  Psychotherapy:  Group  Medications:  See lists  Routine PRN Medications:  Yes  Consultations:  None indicated at this time  Discharge Concerns:  Safety  Other:     Lindell Spar I 5/8/201311:24 AM

## 2012-02-20 NOTE — Tx Team (Signed)
Interdisciplinary Treatment Plan Update (Adult)  Date:  02/20/2012  Time Reviewed:  10:34 AM   Progress in Treatment: Attending groups: No, did not attend aftercare planning group this morning Participating in groups:  No Taking medication as prescribed:  Yes Tolerating medication: Yes Family/Significant othe contact made: Requesting consent to contact support person  Patient understands diagnosis: Yes Discussing patient identified problems/goals with staff:  Yes Medical problems stabilized or resolved: Yes Denies suicidal/homicidal ideation: Yes Issues/concerns per patient self-inventory:  No  Other:  New problem(s) identified: None  Reason for Continuation of Hospitalization: Anxiety Depression Medication Stabilization  Interventions implemented related to continuation of hospitalization:  Medication stabilization, safety checks q 15 mins, group attendance  Additional comments:  Estimated length of stay: 3-5 days   Discharge Plan: Nitin will discharge home and follow up with [facility]  New goal(s):  Review of initial/current patient goals per problem list:   1.  Goal(s): Decrease rating of depressive symptoms to 4 or less  Met:  No  Target date: by discharge  As evidenced by: Lamonta rates depression at 8  today  2.  Goal (s): Decrease rating of anxiety symptoms to 4 or less  Met:  No  Target date: by discharge  As evidenced by: Santi rates anxiety at 8 today  3.  Goal(s): Reduce potential for suicide/self-harm  Met:  Yes  Target date: by discharge  As evidenced by: Hartman denies SI today  4.  Goal(s): Medication stabilization  Met:  No  Target date: by discharge   As evidenced by: Shrihaan will report medications are working to decrease symptoms without intolerable side effects  Attendees: Patient:  Jose Conway 02/20/2012 10:34 AM  Family:     Physician:  Dr Rudean Curt, MD 02/20/2012 10:34 AM  Nursing:   Beverly Sessions, RN 02/20/2012 10:34 AM   Case Manager:  Joette Catching, LCSW 02/20/2012 10:34 AM  Counselor:  Lillie Fragmin, LCSW 02/20/2012 10:34 AM  Other:  Regan Lemming, LCSWA 02/20/2012 10:34 AM  Other:  Majel Homer, doctoral psych intern 02/20/2012 10:34 AM  Other:  Agustina Caroli, NP 02/20/2012 10:34 AM  Other:      Scribe for Treatment Team:   [name], 02/20/2012 10:34 AM

## 2012-02-20 NOTE — Progress Notes (Signed)
Pt is pleasant and cooperative. Pt rates depression and hopelessness at a 6. Pt denies SI/HI. Pt attends groups. Pt was offered support and encouragement. Pt main goal is to leave her and go to a long term facility. Pt is receptive to care and safety maintained on the unit.

## 2012-02-20 NOTE — Discharge Planning (Signed)
New patient here due to depression, cocaine use.  States he got out of [facility] in [place] in January.  They warned him not to return home and reunite with wife.  He did.  It was a mistake.  Wants to go back to [facility].  I faxed his info to them after they told me he had completed the program and was eligible for re-entry.

## 2012-02-21 LAB — HEMOGLOBIN A1C
Hgb A1c MFr Bld: 9.3 % — ABNORMAL HIGH (ref <5.7)
Mean Plasma Glucose: 220 mg/dL — ABNORMAL HIGH (ref <117)

## 2012-02-21 LAB — GLUCOSE, CAPILLARY: Glucose-Capillary: 182 mg/dL — ABNORMAL HIGH (ref 70–99)

## 2012-02-21 MED ORDER — QUETIAPINE FUMARATE 400 MG PO TABS
400.0000 mg | ORAL_TABLET | Freq: Every day | ORAL | Status: DC
  Administered 2012-02-21 – 2012-02-24 (×4): 400 mg via ORAL
  Filled 2012-02-21 (×6): qty 1

## 2012-02-21 NOTE — Progress Notes (Signed)
Patient seen during d/c planning group.  He continues to endorse depression and anxiety rated at six.  He is currently denying SI/HI.  Patient shared concerns regarding medications and was encouraged to discuss those concerns with MD.  He shared he is interested in returning to [facility] in [place] for residential treatment.  Contact to be made with [facility] regarding placement.

## 2012-02-21 NOTE — Tx Team (Signed)
Interdisciplinary Treatment Plan Update (Adult)  Date:  02/21/2012  Time Reviewed:  11:00 AM   Progress in Treatment: Attending groups:   Yes   Participating in groups:  Yes Taking medication as prescribed:  Yes Tolerating medication:  Yes Family/Significant othe contact made: Counselor to follow up with patient regarding collateral contact Patient understands diagnosis:  Yes Discussing patient identified problems/goals with staff: Yes Medical problems stabilized or resolved: Yes Denies suicidal/homicidal ideation:Yes Issues/concerns per patient self-inventory:  Other:  New problem(s) identified:  Reason for Continuation of Hospitalization: Anxiety Depression Medication stabilization  Interventions implemented related to continuation of hospitalization:  Medication Management; safety checks q 15 mins  Additional comments:  Estimated length of stay:  Discharge Plan:  New goal(s):  Review of initial/current patient goals per problem list:    1.  Goal(s):  Patient will no longer endorse SI or other thoughts of self-harm  Met:  Yes  Target date: d/c  As evidenced by: Patient is no longer endorsing SI   2.  Goal (s):  Reduce depression/anxiety (currently rates both at six)  Met:  No  Target date: d/c  As evidenced by:  Patient will rate symptoms at four or below  3.  Goal(s):  Stabilize on medications   Met:  No  Target date: d/c  As evidenced by: Patient will report medications are working - symptoms improving  4.  Goal(s):  Refer for residential treatment at [facility] in [place]  Met:  No  Target date: d/c  As evidenced by:  Treatment program will be in place  Attendees: Patient:     Family:     Physician:  Rudean Curt, MD 02/21/2012 11:00 AM   Nursing:   Haskell Flirt, RN 02/21/2012 11:00 AM   CaseManager:  Joette Catching, LCSW 02/21/2012 11:00 AM   Counselor:  Lillie Fragmin, LCSW 02/21/2012 11:00 AM   Other:  Karie Georges, RN 02/21/2012 11:00  AM   Other:  Regan Lemming, LCSWA 02/21/2012  11:00 AM   Other:  Arna Snipe, RN 02/21/2012  11:01 AM  Other:      Scribe for Treatment Team:   Concha Pyo, LCSW,  02/21/2012 11:00 AM

## 2012-02-21 NOTE — [facility] Counselor (Signed)
Adult Comprehensive Assessment  Patient ID: Torris Matsuoka, male   DOB: 1961/08/06, 51 y.o.   MRN: [id number]  Information Source: Information source: Patient  Current Stressors:  Educational / Learning stressors: no stressors reported Employment / Job issues: disabled Family Relationships: separated from wife Museum/gallery curator / Lack of resources (include bankruptcy): no stressors reported Housing / Lack of housing: lives on the streets Physical health (include injuries & life threatening diseases): diabetes Social relationships: few to no supports Substance abuse: cocaine abuse Bereavement / Loss: loss of marriage, loss of friend who he found dead  Living/Environment/Situation:  Living Arrangements:  (Homeless ) Living conditions (as described by patient or guardian): lives on the streets How long has patient lived in current situation?: a few days What is atmosphere in current home: Chaotic (stressful)  Family History:  Marital status: Separated Separated, when?: 2.5 years, but were still living together sporadically until a few days ago What types of issues is patient dealing with in the relationship?: has been chronically fighting with wife for a long while; reports wife has been "stalking" him since they separated and just keeps showing up wherever he is Does patient have children?: No  Childhood History:  By whom was/is the patient raised?: Both parents Description of patient's relationship with caregiver when they were a child: not close Patient's description of current relationship with people who raised him/her: estranged from both Does patient have siblings?: Yes Number of Siblings: 2  Description of patient's current relationship with siblings: "none of my family socializes with me or anything" Did patient suffer any verbal/emotional/physical/sexual abuse as a child?:  (won't discuss childhood abuse- physical, emotional, sexua ) Did patient suffer from severe childhood  neglect?: No Has patient ever been sexually abused/assaulted/raped as an adolescent or adult?: No Was the patient ever a victim of a crime or a disaster?: No Witnessed domestic violence?: No Has patient been effected by domestic violence as an adult?: No  Education:  Highest grade of school patient has completed: high school graduate Currently a Ship broker?: No Learning disability?: No  Employment/Work Situation:   Employment situation: On disability Why is patient on disability: diabetes and blood pressure as well as depression How long has patient been on disability: a few years Patient's job has been impacted by current illness: No What is the longest time patient has a held a job?: a couple years 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 Has patient ever been in the [employer]?: No Has patient ever served in Recruitment consultant?: No  Financial Resources:   Financial resources: Lexicographer SSDI Does patient have a Programmer, applications or guardian?: No  Alcohol/Substance Abuse:   What has been your use of drugs/alcohol within the last 12 months?: use of cocaine for about 15 years, only uses about monthly but uses $200 at a time If attempted suicide, did drugs/alcohol play a role in this?: No Alcohol/Substance Abuse Treatment Hx: Past Tx, Inpatient If yes, describe treatment: [facility], [facility] Has alcohol/substance abuse ever caused legal problems?: Yes (history of problems related to drinking and cocaine)  Social Support System:   Patient's Community Support System: Poor Describe Community Support System: had a friend but he died, some AA contacts Type of faith/religion: believes in a higher  power  How does patient's faith help to cope with current illness?: gives him hope to live  Leisure/Recreation:   Leisure and Hobbies: throwing horsehoes, playing games, playing pool, going to meetings  Strengths/Needs:   What things  does the patient  do well?: likes to be active, likes to be around other people  In what areas does patient struggle / problems for patient: cocaine abuse for 15 years, depression for 20, suicidal thoughts,   Discharge Plan:   Does patient have access to transportation?: Yes Will patient be returning to same living situation after discharge?: No Plan for living situation after discharge: wants to go to [facility] Currently receiving community mental health services: [facility] If no, would patient like referral for services when discharged?: No Does patient have financial barriers related to discharge medications?: Yes Patient description of barriers related to discharge medications: no income or insurance - refer back to [place]  Summary/Recommendations:   Summary and Recommendations (to be completed by the evaluator): Anibal is a 51 year old separated male diagnosed with Major Depressive Disorder. He has been dealing with depression for most of his life, but in the past couple of years it has gotten worse due to separation from wife and her constantly contacting him or following him. She wants to get back together and he came home from [facility] to try but it did not work out. Very happy at [facility] - felt like he had a family there and wants to get back to that structure. Has been using cocaine again and wants to go back into treatment for that. Laik would benefit from crisis stabilization, medication evaluation, therapy groups for processing thoughts/feelings/experiences, psychoed groups for coping skills and case management for discharge planning.   Sheppard Coil, Leida Lauth. 02/21/2012

## 2012-02-21 NOTE — Progress Notes (Signed)
Pt rates depression at a 3 and hopelessness at a 1. Pt attends groups and interacts well with peers. [facility]. Pt was offered support and encouragement. Pt receptive to treatment and safety maintained on the unit.

## 2012-02-21 NOTE — Progress Notes (Signed)
Patient ID: Jose Conway, male   DOB: November 10, 1960, 51 y.o.   MRN: [id number] Pt stated he and his wife have been separated 2.5 yrs, but that she came kept calling him to come home. Stated that she told him she was dying.  "She has HIV and breast cancer, so she does have things that she could die from". Pt stated that he was in a "program" in [place] for 6 months prior to coming back to [place] a month ago. Now that he knows the relationship is not going to work, pt plans to go back to [place].

## 2012-02-21 NOTE — Progress Notes (Signed)
[place] Group Notes: (Counselor/Nursing/MHT/Case Management/Adjunct)  02/21/2012 @ 11:00am  Finding Balance in Life   Type of Therapy: Group Therapy   Participation Level: Limited  Participation Quality: Attentive   Affect: Blunted   Cognitive: Appropriate   Insight: Limited  Engagement in Group: Limted  Engagement in Therapy: None   Modes of Intervention: Support and Exploration   Summary of Progress/Problems: Jose Conway was attentive but not engaged in first part of group process. Near the end of group he spoke up to another group member who talked about wanting to take back her own control and responsiblity after 12 years of husband usurping it. Jose Conway talked about the difference between separation and divorce, stating that divorce is the ultimate way of taking back one's responsibility.    [name]  02/21/2012 2:31 PM  [place] Group Notes: (Counselor/Nursing/MHT/Case Management/Adjunct)  02/21/2012 @1 :15pm  Boundaries   Type of Therapy: Group Therapy   Participation Level: None   Participation Quality: Attentive   Affect: Blunted   Cognitive: Appropriate   Insight: None   Engagement in Group: None   Engagement in Therapy: None   Modes of Intervention: Support and Exploration   Summary of Progress/Problems: Jose Conway was attentive but not engaged in group process     [name]  02/21/2012 2:31 PM

## 2012-02-22 LAB — GLUCOSE, CAPILLARY
Glucose-Capillary: 195 mg/dL — ABNORMAL HIGH (ref 70–99)
Glucose-Capillary: 195 mg/dL — ABNORMAL HIGH (ref 70–99)
Glucose-Capillary: 243 mg/dL — ABNORMAL HIGH (ref 70–99)

## 2012-02-22 NOTE — Progress Notes (Signed)
Inpatient Diabetes Program Recommendations  AACE/ADA: New Consensus Statement on Inpatient Glycemic Control (2009)  Target Ranges:  Prepandial:   less than 140 mg/dL      Peak postprandial:   less than 180 mg/dL (1-2 hours)      Critically ill patients:  140 - 180 mg/dL   Reason for Visit: Hyperglycemia - Elevated HgbA1C  Results for Jose Conway, Jose Conway (MRN [id number]) as of 02/22/2012 15:32  Ref. Range 02/20/2012 17:10 02/20/2012 20:54 02/21/2012 06:28 02/21/2012 11:54 02/21/2012 17:14 02/21/2012 21:14 02/22/2012 06:24 02/22/2012 11:58  Glucose-Capillary Latest Range: 70-99 mg/dL 292 (H) 160 (H) 234 (H) 182 (H) 231 (H) 134 (H) 195 (H) 195 (H)  Results for Jose Conway, Jose Conway (MRN [id number]) as of 02/22/2012 15:32  Ref. Range 02/20/2012 20:02  Hemoglobin A1C Latest Range: <5.7 % 9.3 (H)   Inpatient Diabetes Program Recommendations Insulin - Basal: Add Lantus 10 units QHS HgbA1C: 9.3% on 02/20/2012 - uncontrolled DM  Note: Will follow.

## 2012-02-22 NOTE — Progress Notes (Signed)
02/22/2012         Time: 1415      Group Topic/Focus: The focus of this group is on enhancing the patient's understanding of leisure, barriers to leisure, and the importance of engaging in positive leisure activities upon discharge for improved total health.  Participation Level: Active  Participation Quality: Appropriate and Attentive  Affect: Appropriate  Cognitive: Oriented   Additional Comments: Patient able to identify positive leisure activities he can utilize upon discharge.   Jose Conway 02/22/2012 3:17 PM

## 2012-02-22 NOTE — Progress Notes (Signed)
[facility] Adult Inpatient Family/Significant Other Suicide Prevention Education  Suicide Prevention Education:  Patient Refusal for Family/Significant Other Suicide Prevention Education: The patient Jose Conway has refused to provide written consent for family/significant other to be provided Family/Significant Other Suicide Prevention Education during admission and/or prior to discharge.  Physician notified.  Darla reports that he cannot think of anyone who would be supportive to him and who he would want suicide prevention information given to. Counselor reviewed suicide risk factors, warning signs, and crisis contacts with [place] and encouraged him to share the suicide prevention pamphlet with a support person.  [name] 02/22/2012, 4:33 PM

## 2012-02-22 NOTE — Progress Notes (Signed)
[facility] MD Progress Note  02/22/2012 10:40 PM  S: "I'm doing better. I slept well last night. My mood is good today"  Diagnosis:   Axis I: Schizoaffective Disorder, depressed type Axis II: Deferred Axis III:  Past Medical History  Diagnosis Date  . Depression   . Hypertension    Axis IV: No changes Axis V: 51-60 moderate symptoms  ADL's:  Intact  Sleep: Good  Appetite:  Good  Suicidal Ideation:  Plan:  No Intent:  No Means:  No Homicidal Ideation:  Plan:  No Intent:  No Means:  No  AEB (as evidenced by): Per patient's report  Mental Status Examination/Evaluation: Objective:  Appearance: Casual  Eye Contact::  Good  Speech:  Clear and Coherent  Volume:  Normal  Mood:  Euthymic  Affect:  Flat  Thought Process:  Coherent  Orientation:  Full  Thought Content:  Rumination  Suicidal Thoughts:  No  Homicidal Thoughts:  No  Memory:  Immediate;   Good Recent;   Good Remote;   Good  Judgement:  Fair  Insight:  Fair  Psychomotor Activity:  Normal  Concentration:  Good  Recall:  Good  Akathisia:  No  Handed:  Right  AIMS (if indicated):     Assets:  Desire for Improvement  Sleep:  Number of Hours: 5.75    Vital Signs:Blood pressure 134/80, pulse 87, temperature 97.1 F (36.2 C), temperature source Oral, resp. rate 16, height 5\' 10"  (1.778 m), weight 97.07 kg (214 lb). Current Medications: Current Facility-Administered Medications  Medication Dose Route Frequency Provider Last Rate Last Dose  . acetaminophen (TYLENOL) tablet 650 mg  650 mg Oral Q6H PRN Nena Polio, PA-C      . alum & mag hydroxide-simeth (MAALOX/MYLANTA) 200-200-20 MG/5ML suspension 30 mL  30 mL Oral Q4H PRN Nena Polio, PA-C      . amLODipine (NORVASC) tablet 10 mg  10 mg Oral Daily Encarnacion Slates, NP   10 mg at 02/22/12 0819  . DULoxetine (CYMBALTA) DR capsule 60 mg  60 mg Oral Daily Darrol Jump, MD   60 mg at 02/22/12 0819  . gabapentin (NEURONTIN) capsule 600 mg  600 mg Oral QID Darrol Jump, MD   600 mg at 02/22/12 1957  . insulin aspart (novoLOG) injection 0-15 Units  0-15 Units Subcutaneous TID WC Encarnacion Slates, NP   5 Units at 02/22/12 1718  . insulin aspart (novoLOG) injection 0-5 Units  0-5 Units Subcutaneous QHS Encarnacion Slates, NP      . insulin aspart (novoLOG) injection 4 Units  4 Units Subcutaneous TID WC Encarnacion Slates, NP   4 Units at 02/22/12 1717  . magnesium hydroxide (MILK OF MAGNESIA) suspension 30 mL  30 mL Oral Daily PRN Nena Polio, PA-C      . metFORMIN (GLUCOPHAGE) tablet 1,000 mg  1,000 mg Oral BID WC Encarnacion Slates, NP   1,000 mg at 02/22/12 1715  . QUEtiapine (SEROQUEL) tablet 400 mg  400 mg Oral QHS Darrol Jump, MD   400 mg at 02/22/12 2139  . Vitamin D (Ergocalciferol) (DRISDOL) capsule 50,000 Units  50,000 Units Oral Q7 days Encarnacion Slates, NP        Lab Results:  Results for orders placed during the hospital encounter of 02/19/12 (from the past 48 hour(s))  GLUCOSE, CAPILLARY     Status: Abnormal   Collection Time   02/21/12  6:28 AM      Component Value Range  Comment   Glucose-Capillary 234 (*) 70 - 99 (mg/dL)   GLUCOSE, CAPILLARY     Status: Abnormal   Collection Time   02/21/12 11:54 AM      Component Value Range Comment   Glucose-Capillary 182 (*) 70 - 99 (mg/dL)   GLUCOSE, CAPILLARY     Status: Abnormal   Collection Time   02/21/12  5:14 PM      Component Value Range Comment   Glucose-Capillary 231 (*) 70 - 99 (mg/dL)   GLUCOSE, CAPILLARY     Status: Abnormal   Collection Time   02/21/12  9:14 PM      Component Value Range Comment   Glucose-Capillary 134 (*) 70 - 99 (mg/dL)   GLUCOSE, CAPILLARY     Status: Abnormal   Collection Time   02/22/12  6:24 AM      Component Value Range Comment   Glucose-Capillary 195 (*) 70 - 99 (mg/dL)   GLUCOSE, CAPILLARY     Status: Abnormal   Collection Time   02/22/12 11:58 AM      Component Value Range Comment   Glucose-Capillary 195 (*) 70 - 99 (mg/dL)   GLUCOSE, CAPILLARY     Status: Abnormal    Collection Time   02/22/12  4:58 PM      Component Value Range Comment   Glucose-Capillary 243 (*) 70 - 99 (mg/dL)     Physical Findings: AIMS:  , ,  ,  ,    CIWA:    COWS:     Treatment Plan Summary: Daily contact with patient to assess and evaluate symptoms and progress in treatment Medication management  Plan: Continue current treatment plan.  Lindell Spar I 02/22/2012, 10:40 PM

## 2012-02-22 NOTE — Progress Notes (Signed)
Pt denies SI/HI/AVH. Pt mood depressed. Affect is flat. Pt cooperative. Pt states that he wishes he never would have gotten back with his wife after leaving. Pt states that he feels that he has wasted time in that relationship that he cannot get back. Support and encouragement offered. Pt receptive.

## 2012-02-22 NOTE — Progress Notes (Signed)
[place] Group Notes: (Counselor/Nursing/MHT/Case Management/Adjunct) 02/22/2012   @11 :00am Preventing Relapse  Type of Therapy:  Group Therapy  Participation Level:  None  Participation Quality: None   Affect:  Blunted  Cognitive:  Appropriate  Insight:  None  Engagement in Group: None  Engagement in Therapy:  None  Modes of Intervention:  Support and Exploration  Summary of Progress/Problems: Jose Conway  was attentive but not engaged in group process    Jose Conway 02/22/2012 2:55 PM

## 2012-02-22 NOTE — Progress Notes (Signed)
Patient seen during d/c planning group.  He reports being a little better today and denies SI/HI.  He rates depression and hopelessness at two and anxiety at five.  Patient shared concerns that his pain medication is not working as needed.  Patient encouraged to discharge medication concerns with MD.

## 2012-02-22 NOTE — Progress Notes (Signed)
Patient ID: Jose Conway, male   DOB: 01-16-1961, 51 y.o.   MRN: [id number] Pt pleasant and cooperative during shift. Pt compliant with medications; no inappropriate behaviors noted.

## 2012-02-22 NOTE — Progress Notes (Signed)
[facility] MD Progress Note  02/22/2012 12:48 AM  Diagnosis:  Axis I: Schizoaffective Disorder and Cocaone Dependence  ADL's:  Intact  Sleep: Poor, he requests Seroquel and is willing to try just Seroquel without the Ambien that he has been insistent on yesterday.  Appetite:  Good  Suicidal Ideation:  Pt denies any suicidal thoughts. Homicidal Ideation:  Denies adamantly any homicidal thoughts.  Mental Status Examination/Evaluation: Objective:  Appearance: Casual  Eye Contact::  Good  Speech:  Clear and Coherent  Volume:  Normal  Mood:  Dysphoric  Affect:  Blunt  Thought Process:  Coherent  Orientation:  Full  Thought Content:  WDL  Suicidal Thoughts:  No  Homicidal Thoughts:  No  Memory:  Immediate;   Fair  Judgement:  Impaired  Insight:  Lacking  Psychomotor Activity:  Normal  Concentration:  Fair  Recall:  Fair  Akathisia:  No  Handed:  Right  AIMS (if indicated):     Assets:  Communication Skills Desire for Improvement  Sleep:  Number of Hours: 4.25    ROS: Neuro: no headaches, ataxia, weakness  GI: no N/V/D/cramps/constipation  MS: leg pain that he feels is no different than before, but no weakness, muscle cramps, aches.  Vital Signs:Blood pressure 132/80, pulse 81, temperature 97.7 F (36.5 C), temperature source Oral, resp. rate 16, height 5\' 10"  (1.778 m), weight 97.07 kg (214 lb). Current Medications: Current Facility-Administered Medications  Medication Dose Route Frequency Provider Last Rate Last Dose  . acetaminophen (TYLENOL) tablet 650 mg  650 mg Oral Q6H PRN Nena Polio, PA-C      . alum & mag hydroxide-simeth (MAALOX/MYLANTA) 200-200-20 MG/5ML suspension 30 mL  30 mL Oral Q4H PRN Nena Polio, PA-C      . amLODipine (NORVASC) tablet 10 mg  10 mg Oral Daily Encarnacion Slates, NP   10 mg at 02/21/12 0800  . DULoxetine (CYMBALTA) DR capsule 30 mg  30 mg Oral Daily Darrol Jump, MD   30 mg at 02/21/12 0802   Followed by  . DULoxetine (CYMBALTA) DR  capsule 60 mg  60 mg Oral Daily Darrol Jump, MD      . gabapentin (NEURONTIN) capsule 600 mg  600 mg Oral QID Darrol Jump, MD   600 mg at 02/21/12 2150  . insulin aspart (novoLOG) injection 0-15 Units  0-15 Units Subcutaneous TID WC Encarnacion Slates, NP   5 Units at 02/21/12 1700  . insulin aspart (novoLOG) injection 0-5 Units  0-5 Units Subcutaneous QHS Encarnacion Slates, NP      . insulin aspart (novoLOG) injection 4 Units  4 Units Subcutaneous TID WC Encarnacion Slates, NP   4 Units at 02/21/12 1700  . magnesium hydroxide (MILK OF MAGNESIA) suspension 30 mL  30 mL Oral Daily PRN Nena Polio, PA-C      . metFORMIN (GLUCOPHAGE) tablet 1,000 mg  1,000 mg Oral BID WC Encarnacion Slates, NP   1,000 mg at 02/21/12 1702  . QUEtiapine (SEROQUEL) tablet 400 mg  400 mg Oral QHS Darrol Jump, MD   400 mg at 02/21/12 2150  . Vitamin D (Ergocalciferol) (DRISDOL) capsule 50,000 Units  50,000 Units Oral Q7 days Encarnacion Slates, NP      . DISCONTD: prazosin (MINIPRESS) capsule 1 mg  1 mg Oral QHS,MR X 1 Darrol Jump, MD   1 mg at 02/20/12 2314    Lab Results:  Results for orders placed during the hospital encounter of 02/19/12 (  from the past 48 hour(s))  GLUCOSE, CAPILLARY     Status: Abnormal   Collection Time   02/20/12  5:10 PM      Component Value Range Comment   Glucose-Capillary 292 (*) 70 - 99 (mg/dL)    Comment 1 Notify RN     HEMOGLOBIN A1C     Status: Abnormal   Collection Time   02/20/12  8:02 PM      Component Value Range Comment   Hemoglobin A1C 9.3 (*) <5.7 (%)    Mean Plasma Glucose 220 (*) <117 (mg/dL)   GLUCOSE, CAPILLARY     Status: Abnormal   Collection Time   02/20/12  8:54 PM      Component Value Range Comment   Glucose-Capillary 160 (*) 70 - 99 (mg/dL)    Comment 1 Notify RN     GLUCOSE, CAPILLARY     Status: Abnormal   Collection Time   02/21/12  6:28 AM      Component Value Range Comment   Glucose-Capillary 234 (*) 70 - 99 (mg/dL)   GLUCOSE, CAPILLARY     Status: Abnormal    Collection Time   02/21/12 11:54 AM      Component Value Range Comment   Glucose-Capillary 182 (*) 70 - 99 (mg/dL)   GLUCOSE, CAPILLARY     Status: Abnormal   Collection Time   02/21/12  5:14 PM      Component Value Range Comment   Glucose-Capillary 231 (*) 70 - 99 (mg/dL)   GLUCOSE, CAPILLARY     Status: Abnormal   Collection Time   02/21/12  9:14 PM      Component Value Range Comment   Glucose-Capillary 134 (*) 70 - 99 (mg/dL)     Physical Findings: AIMS:  , ,  ,  ,    CIWA:    COWS:     Treatment Plan Summary: Daily contact with patient to assess and evaluate symptoms and progress in treatment Medication management  Discussion/Plan: Pt agrees to try 400 mg Seroquel for his sleeping problem.  It might also help his mood dyscontrol too.   Kearstin Learn 02/22/2012, 12:48 AM

## 2012-02-23 DIAGNOSIS — F1420 Cocaine dependence, uncomplicated: Secondary | ICD-10-CM

## 2012-02-23 LAB — GLUCOSE, CAPILLARY: Glucose-Capillary: 124 mg/dL — ABNORMAL HIGH (ref 70–99)

## 2012-02-23 MED ORDER — LIDOCAINE 5 % EX PTCH: 1.0000 | MEDICATED_PATCH | Freq: Every day | CUTANEOUS | Status: DC

## 2012-02-23 MED ORDER — LIDOCAINE 5 % EX PTCH
1.0000 | MEDICATED_PATCH | Freq: Every day | CUTANEOUS | Status: DC
  Administered 2012-02-23 – 2012-02-25 (×3): 1 via TRANSDERMAL
  Filled 2012-02-23 (×5): qty 1

## 2012-02-23 NOTE — Progress Notes (Signed)
Patient was given his scheduled 2000 medication and he informed writer that he didn't know why he was not being given his pain medication because neurontin was not helping his pain, patient was offered tylenol and heat pack for his back but declined both.  Patient was supported and encouraged to speak with on call doctor or NP on tomorrow. Patient denies si/hi/a/v hall. Safety maintained, will continue to monitor.

## 2012-02-23 NOTE — Progress Notes (Signed)
[facility] MD Progress Note  02/23/2012 11:42 AM  Diagnosis:   Axis I: Schizoaffective Disorder and Substance Abuse Axis II: Deferred Axis III:  Past Medical History  Diagnosis Date  . Depression   . Hypertension    Subjective: Jose Conway reports that he is experiencing pain in his back and legs today.  He reports that the Neurontin and Cymbalta prescribed for his pain is not helping. He states he is always been on opiate pain relievers. He does state that "maybe that place on going to we'll allow that type of medication." He reports that he is waiting on word for a possible admission to [facility] in [place]. He denies any suicidal or homicidal ideation, but rates his depression as a 9 on a scale of 1-10, and rates his anxiety as a 6 on a scale of 1-10. He reports that he did sleep well last night, and that his appetite is good.  ADL's:  Intact  Sleep: Good  Appetite:  Good  Suicidal Ideation:  Patient denies any thought plan or intent Homicidal Ideation:  Patient denies any thought plan or intent  AEB (as evidenced by):  Mental Status Examination/Evaluation: Objective:  Appearance: Disheveled  Eye Contact::  Poor  Speech:  Clear and Coherent  Volume:  Decreased  Mood:  Depressed  Affect:  Blunt  Thought Process:  Circumstantial  Orientation:  Full  Thought Content:  WDL  Suicidal Thoughts:  No  Homicidal Thoughts:  No  Memory:  Immediate;   Good  Judgement:  Impaired  Insight:  Lacking  Psychomotor Activity:  Psychomotor Retardation and Shuffling Gait  Concentration:  Fair  Recall:  Good  Akathisia:  No  Handed:    AIMS (if indicated):     Assets:  Desire for Improvement  Sleep:  Number of Hours: 6.25    Vital Signs:Blood pressure 138/82, pulse 76, temperature 97.5 F (36.4 C), temperature source Oral, resp. rate 18, height 5\' 10"  (1.778 m), weight 97.07 kg (214 lb). Current Medications: Current Facility-Administered Medications  Medication Dose Route Frequency  Provider Last Rate Last Dose  . acetaminophen (TYLENOL) tablet 650 mg  650 mg Oral Q6H PRN Nena Polio, PA-C      . alum & mag hydroxide-simeth (MAALOX/MYLANTA) 200-200-20 MG/5ML suspension 30 mL  30 mL Oral Q4H PRN Nena Polio, PA-C      . amLODipine (NORVASC) tablet 10 mg  10 mg Oral Daily Encarnacion Slates, NP   10 mg at 02/23/12 0909  . DULoxetine (CYMBALTA) DR capsule 60 mg  60 mg Oral Daily Darrol Jump, MD   60 mg at 02/23/12 0909  . gabapentin (NEURONTIN) capsule 600 mg  600 mg Oral QID Darrol Jump, MD   600 mg at 02/23/12 0909  . insulin aspart (novoLOG) injection 0-15 Units  0-15 Units Subcutaneous TID WC Encarnacion Slates, NP   3 Units at 02/23/12 [phone]  . insulin aspart (novoLOG) injection 0-5 Units  0-5 Units Subcutaneous QHS Encarnacion Slates, NP      . insulin aspart (novoLOG) injection 4 Units  4 Units Subcutaneous TID WC Encarnacion Slates, NP   4 Units at 02/23/12 0647  . lidocaine (LIDODERM) 5 % 1 patch  1 patch Transdermal Q24H Jimmye Norman, PA-C      . magnesium hydroxide (MILK OF MAGNESIA) suspension 30 mL  30 mL Oral Daily PRN Nena Polio, PA-C      . metFORMIN (GLUCOPHAGE) tablet 1,000 mg  1,000 mg Oral BID WC Loleta Dicker  Nwoko, NP   1,000 mg at 02/23/12 0908  . QUEtiapine (SEROQUEL) tablet 400 mg  400 mg Oral QHS Darrol Jump, MD   400 mg at 02/22/12 2139  . Vitamin D (Ergocalciferol) (DRISDOL) capsule 50,000 Units  50,000 Units Oral Q7 days Encarnacion Slates, NP        Lab Results:  Results for orders placed during the hospital encounter of 02/19/12 (from the past 48 hour(s))  GLUCOSE, CAPILLARY     Status: Abnormal   Collection Time   02/21/12 11:54 AM      Component Value Range Comment   Glucose-Capillary 182 (*) 70 - 99 (mg/dL)   GLUCOSE, CAPILLARY     Status: Abnormal   Collection Time   02/21/12  5:14 PM      Component Value Range Comment   Glucose-Capillary 231 (*) 70 - 99 (mg/dL)   GLUCOSE, CAPILLARY     Status: Abnormal   Collection Time   02/21/12  9:14 PM      Component  Value Range Comment   Glucose-Capillary 134 (*) 70 - 99 (mg/dL)   GLUCOSE, CAPILLARY     Status: Abnormal   Collection Time   02/22/12  6:24 AM      Component Value Range Comment   Glucose-Capillary 195 (*) 70 - 99 (mg/dL)   GLUCOSE, CAPILLARY     Status: Abnormal   Collection Time   02/22/12 11:58 AM      Component Value Range Comment   Glucose-Capillary 195 (*) 70 - 99 (mg/dL)   GLUCOSE, CAPILLARY     Status: Abnormal   Collection Time   02/22/12  4:58 PM      Component Value Range Comment   Glucose-Capillary 243 (*) 70 - 99 (mg/dL)     Physical Findings: AIMS:  , ,  ,  ,    CIWA:    COWS:     Treatment Plan Summary: Daily contact with patient to assess and evaluate symptoms and progress in treatment Medication management  Plan: We will continue his current plan of care, including the Cymbalta and Neurontin. We will try a Lidoderm patch for pain. We will continue to research possible followup plans.  Jenalyn Girdner 02/23/2012, 11:42 AM

## 2012-02-23 NOTE — Progress Notes (Signed)
Pt attending the groups and interacting with his peers. Continues to complain of back pain and states that the patch is not helping that much. Rates his depression and his hopelessness both at a 5. Denies SI and HI. Given support and reassurance.

## 2012-02-24 LAB — GLUCOSE, CAPILLARY: Glucose-Capillary: 188 mg/dL — ABNORMAL HIGH (ref 70–99)

## 2012-02-24 MED ORDER — NAPROXEN 500 MG PO TABS
500.0000 mg | ORAL_TABLET | Freq: Three times a day (TID) | ORAL | Status: DC
  Administered 2012-02-24 – 2012-02-25 (×4): 500 mg via ORAL
  Filled 2012-02-24 (×9): qty 1

## 2012-02-24 MED ORDER — NAPROXEN SODIUM 550 MG PO TABS: 550.0000 mg | ORAL_TABLET | Freq: Three times a day (TID) | ORAL | Status: DC

## 2012-02-24 NOTE — Progress Notes (Signed)
Patient was given his 2000 medication and complained of continuous back pain. Patient was offered tylenol and heat packs but declined both. Patient was given a list of his medications which he requested and encouraged to speak to his doctor concerning his pain because he reports that he is not getting his pain medication. Support and encouragement offered, safety maintained, will continue to monitor.

## 2012-02-24 NOTE — Progress Notes (Signed)
[place] Group Notes:  (Counselor/Nursing/MHT/Case Management/Adjunct)  02/24/2012 6:18 PM  Type of Therapy:  Group Therapy  Participation Level:  Active  Participation Quality:  Appropriate and Attentive  Affect:  Appropriate  Cognitive:  Appropriate  Insight:  Good  Engagement in Group:  Good  Engagement in Therapy:  Good  Modes of Intervention:  Clarification, Limit-setting, Socialization and Support  Summary of Progress/Problems: Pt. participated in group on supports and the difference between health and unhealthy supports. Each pt. Shared who was a support in their life and how that person supports them. Pt.'s participated in the in activity on what it feels to feel supported. Pt. Passed and did not engage in the subject matter. Pt. Was attentive during group.  Jose Conway 02/24/2012, 6:18 PM

## 2012-02-24 NOTE — Progress Notes (Signed)
[facility] MD Progress Note  02/24/2012 12:11 PM  Diagnosis:   Axis I: Schizoaffective Disorder and Substance Abuse Axis II: Deferred Axis III:  Past Medical History  Diagnosis Date  . Depression   . Hypertension    Subjective: Jose Conway is lying in bed awake at noon, complaining of pain. He feels as if his pain is not being addressed. He endorses a depressed mood, and rates his depression as an 8 on a scale of 1-10, 10 being the worst. He also rates his anxiety as of 4 on a scale of 1-10. He denies any suicidal or homicidal ideation. He denies any auditory or visual hallucinations. He reports that he slept well last night, and that his appetite is good.  ADL's:  Intact  Sleep: Good  Appetite:  Good  Suicidal Ideation:  Patient denies any thought, plan, or intent Homicidal Ideation:  Patient denies any thought, plan, or intent  AEB (as evidenced by):  Mental Status Examination/Evaluation: Objective:  Appearance: Disheveled  Eye Contact::  Fair  Speech:  Clear and Coherent  Volume:  Decreased  Mood:  Depressed and Irritable  Affect:  Congruent  Thought Process:  Circumstantial and Logical  Orientation:  Full  Thought Content:  WDL  Suicidal Thoughts:  No  Homicidal Thoughts:  No  Memory:  Immediate;   Good  Judgement:  Fair  Insight:  Fair  Psychomotor Activity:  Decreased  Concentration:  Good  Recall:  Good  Akathisia:  No  Handed:    AIMS (if indicated):     Assets:    Sleep:  Number of Hours: 5.25    Vital Signs:Blood pressure 118/72, pulse 85, temperature 98.3 F (36.8 C), temperature source Oral, resp. rate 16, height 5\' 10"  (1.778 m), weight 97.07 kg (214 lb). Current Medications: Current Facility-Administered Medications  Medication Dose Route Frequency Provider Last Rate Last Dose  . acetaminophen (TYLENOL) tablet 650 mg  650 mg Oral Q6H PRN Nena Polio, PA-C      . alum & mag hydroxide-simeth (MAALOX/MYLANTA) 200-200-20 MG/5ML suspension 30 mL  30 mL Oral  Q4H PRN Nena Polio, PA-C      . amLODipine (NORVASC) tablet 10 mg  10 mg Oral Daily Encarnacion Slates, NP   10 mg at 02/24/12 0836  . DULoxetine (CYMBALTA) DR capsule 60 mg  60 mg Oral Daily Darrol Jump, MD   60 mg at 02/24/12 0837  . gabapentin (NEURONTIN) capsule 600 mg  600 mg Oral QID Darrol Jump, MD   600 mg at 02/24/12 [id number]  . insulin aspart (novoLOG) injection 0-15 Units  0-15 Units Subcutaneous TID WC Encarnacion Slates, NP   2 Units at 02/24/12 [phone]  . insulin aspart (novoLOG) injection 0-5 Units  0-5 Units Subcutaneous QHS Encarnacion Slates, NP      . insulin aspart (novoLOG) injection 4 Units  4 Units Subcutaneous TID WC Encarnacion Slates, NP   4 Units at 02/24/12 [phone]  . lidocaine (LIDODERM) 5 % 1 patch  1 patch Transdermal Q breakfast Alyson Kuroski-Mazzei, DO   1 patch at 02/23/12 1207  . magnesium hydroxide (MILK OF MAGNESIA) suspension 30 mL  30 mL Oral Daily PRN Nena Polio, PA-C      . metFORMIN (GLUCOPHAGE) tablet 1,000 mg  1,000 mg Oral BID WC Encarnacion Slates, NP   1,000 mg at 02/24/12 0833  . QUEtiapine (SEROQUEL) tablet 400 mg  400 mg Oral QHS Darrol Jump, MD   400 mg at 02/23/12  2243  . Vitamin D (Ergocalciferol) (DRISDOL) capsule 50,000 Units  50,000 Units Oral Q7 days Encarnacion Slates, NP        Lab Results:  Results for orders placed during the hospital encounter of 02/19/12 (from the past 48 hour(s))  GLUCOSE, CAPILLARY     Status: Abnormal   Collection Time   02/22/12  4:58 PM      Component Value Range Comment   Glucose-Capillary 243 (*) 70 - 99 (mg/dL)   GLUCOSE, CAPILLARY     Status: Abnormal   Collection Time   02/23/12 11:43 AM      Component Value Range Comment   Glucose-Capillary 199 (*) 70 - 99 (mg/dL)   GLUCOSE, CAPILLARY     Status: Abnormal   Collection Time   02/23/12  5:17 PM      Component Value Range Comment   Glucose-Capillary 124 (*) 70 - 99 (mg/dL)   GLUCOSE, CAPILLARY     Status: Abnormal   Collection Time   02/24/12  6:21 AM      Component Value  Range Comment   Glucose-Capillary 124 (*) 70 - 99 (mg/dL)   GLUCOSE, CAPILLARY     Status: Abnormal   Collection Time   02/24/12 11:55 AM      Component Value Range Comment   Glucose-Capillary 177 (*) 70 - 99 (mg/dL)    Comment 1 Notify RN       Physical Findings: AIMS:  , ,  ,  ,    CIWA:    COWS:     Treatment Plan Summary: Daily contact with patient to assess and evaluate symptoms and progress in treatment Medication management  Plan: We will continue his current plan of care. We will add Anaprox for anti-inflammatory effect for pain relief.  Carinne Brandenburger 02/24/2012, 12:11 PM

## 2012-02-24 NOTE — Progress Notes (Signed)
[place] Group Notes:  (Counselor/Nursing/MHT/Case Management/Adjunct)  02/23/12 11:07 AM  Type of Therapy:  After Care Planning Group  Pt.  Participated in after care planning group and was given [place] Suicide Prevention pamphlet and crisis and hotline numbers. Pt. agreed to use them if needed. Pt. Stated he was fine. Denied SI /HI. and that he came to [facility] for SiI thought sand depression and to get his medications worked on. Roslynn Amble 02/23/12, 11:17 AM

## 2012-02-24 NOTE — Progress Notes (Signed)
Pt's affect is flat and mood is depressed. Denies SI and HI. States that he feels in constant pain in his back. States that he feels as though he is not being treated for his pain. In group today Pt talked about the deep pain of depression that he has and that it is like no other. States that he just wants the pain to stop. Given support and encouraged to continue to talk about his feelings.

## 2012-02-25 LAB — GLUCOSE, CAPILLARY
Glucose-Capillary: 161 mg/dL — ABNORMAL HIGH (ref 70–99)
Glucose-Capillary: 133 mg/dL — ABNORMAL HIGH (ref 70–99)

## 2012-02-25 MED ORDER — TUBERCULIN PPD 5 UNIT/0.1ML ID SOLN
5.0000 [IU] | Freq: Once | INTRADERMAL | Status: AC
  Administered 2012-02-25: 5 [IU] via INTRADERMAL

## 2012-02-25 MED ORDER — QUETIAPINE FUMARATE 400 MG PO TABS: 400.0000 mg | ORAL_TABLET | Freq: Every day | ORAL | Status: DC

## 2012-02-25 MED ORDER — DULOXETINE HCL 60 MG PO CPEP: 60.0000 mg | ORAL_CAPSULE | Freq: Every day | ORAL | Status: DC

## 2012-02-25 MED ORDER — METFORMIN HCL 500 MG PO TABS: 1000.0000 mg | ORAL_TABLET | Freq: Two times a day (BID) | ORAL | Status: DC

## 2012-02-25 MED ORDER — GABAPENTIN 300 MG PO CAPS: 600.0000 mg | ORAL_CAPSULE | Freq: Four times a day (QID) | ORAL | Status: AC

## 2012-02-25 MED ORDER — AMLODIPINE BESYLATE 10 MG PO TABS: 10.0000 mg | ORAL_TABLET | Freq: Every morning | ORAL | Status: DC

## 2012-02-25 MED ORDER — VITAMIN D (ERGOCALCIFEROL) 1.25 MG (50000 UNIT) PO CAPS: 50000.0000 [IU] | ORAL_CAPSULE | ORAL | Status: DC

## 2012-02-25 NOTE — Progress Notes (Signed)
Patient accepted for admission to [facility].  Bus routing information placed in patient's chart along with city bus ticket.  Patient also assisted with bag lunch.
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
## 2012-02-25 NOTE — Tx Team (Signed)
Interdisciplinary Treatment Plan Update (Adult)  Date:  02/25/2012  Time Reviewed:  10:40 AM   Progress in Treatment: Attending groups: Yes Participating in groups:  Yes Taking medication as prescribed:  Yes Tolerating medication: Yes Family/Significant othe contact made:  No, Jose Conway refused to allow contact Patient understands diagnosis: Yes Discussing patient identified problems/goals with staff:  Yes Medical problems stabilized or resolved: Yes Denies suicidal/homicidal ideation: Yes Issues/concerns per patient self-inventory:  No  Other:  New problem(s) identified: None  Reason for Continuation of Hospitalization: Appropriate for discharge  Interventions implemented related to continuation of hospitalization:  Medication stabilization, safety checks q 15 mins, group attendance  Additional comments:  Estimated length of stay: 1 day  Discharge Plan: Discharge to [facility] in [place] goal(s):  Review of initial/current patient goals per problem list:    1. Goal(s): Patient will no longer endorse SI or other thoughts of self-harm  Met: Yes  Target date: d/c  As evidenced by:  Jose Conway denies any SI or self-harm thoughts 2. Goal (s): Reduce depression/anxiety to rating of 4 or less Met: Yes Target date: d/c  As evidenced by: Jose Conway rates depression at 3 today and anxiety at 4 3. Goal(s): Stabilize on medications  Met: Yes Target date: d/c  As evidenced by: Jose Conway reports feeling better, medications are working without intolerable side effects 4. Goal(s): Refer for residential treatment at [facility] in [place]  Met: Yes  Target date: d/c  As evidenced by: Referral has been made to [facility], Jose Conway will be able to admit today or tomorrow      Attendees: Patient:  Jose Conway 02/25/2012 10:40 AM  Family:     Physician:  Dr Rudean Curt, MD 02/25/2012 10:40 AM  Nursing:   Bennye Alm, RN 02/25/2012 10:40 AM  Case Manager:   Joette Catching, LCSW 02/25/2012 10:40 AM  Counselor:  Lillie Fragmin, LCSW 02/25/2012 10:40 AM  Other:  Regan Lemming, Barry 02/25/2012 10:40 AM  Other:  Thurnell Garbe, RN 02/25/2012 10:40 AM  Other:  Majel Homer, Doctoral Psych Intern 02/25/2012 10:40 AM  Other:      Scribe for Treatment Team:   [name], 02/25/2012 10:40 AM

## 2012-02-25 NOTE — Progress Notes (Addendum)
[facility] MD Progress Note  02/25/2012 1:55 PM  Diagnosis:  Axis I: Schizoaffective Disorder and Cocaine Dependence  ADL's:  Intact  Sleep: Good  Appetite:  Good  Suicidal Ideation:  Pt denies any suicidal thoughts. Homicidal Ideation:  Denies adamantly any homicidal thoughts.  Mental Status Examination/Evaluation: Objective:  Appearance: Casual  Eye Contact::  Good  Speech:  Clear and Coherent  Volume:  Normal  Mood:  Dysphoric  Affect:  Congruent  Thought Process:  Coherent  Orientation:  Full  Thought Content:  WDL  Suicidal Thoughts:  No  Homicidal Thoughts:  No  Memory:  Immediate;   Good  Judgement:  Fair  Insight:  Good  Psychomotor Activity:  Normal  Concentration:  Good  Recall:  Good  Akathisia:  No  Handed:  Right  AIMS (if indicated):     Assets:  Communication Skills Desire for Improvement  Sleep:  Number of Hours: 6     Vital Signs:Blood pressure 125/65, pulse 88, temperature 97 F (36.1 C), temperature source Oral, resp. rate 16, height 5\' 10"  (1.778 m), weight 97.07 kg (214 lb). Current Medications: Current Facility-Administered Medications  Medication Dose Route Frequency Provider Last Rate Last Dose  . acetaminophen (TYLENOL) tablet 650 mg  650 mg Oral Q6H PRN Nena Polio, PA-C      . alum & mag hydroxide-simeth (MAALOX/MYLANTA) 200-200-20 MG/5ML suspension 30 mL  30 mL Oral Q4H PRN Nena Polio, PA-C      . amLODipine (NORVASC) tablet 10 mg  10 mg Oral Daily Encarnacion Slates, NP   10 mg at 02/25/12 0837  . DULoxetine (CYMBALTA) DR capsule 60 mg  60 mg Oral Daily Darrol Jump, MD   60 mg at 02/25/12 [id number]  . gabapentin (NEURONTIN) capsule 600 mg  600 mg Oral QID Darrol Jump, MD   600 mg at 02/25/12 1204  . insulin aspart (novoLOG) injection 0-15 Units  0-15 Units Subcutaneous TID WC Encarnacion Slates, NP   8 Units at 02/25/12 1206  . insulin aspart (novoLOG) injection 0-5 Units  0-5 Units Subcutaneous QHS Encarnacion Slates, NP      . insulin aspart  (novoLOG) injection 4 Units  4 Units Subcutaneous TID WC Encarnacion Slates, NP   4 Units at 02/25/12 1206  . lidocaine (LIDODERM) 5 % 1 patch  1 patch Transdermal Q breakfast Alyson Kuroski-Mazzei, DO   1 patch at 02/25/12 0800  . magnesium hydroxide (MILK OF MAGNESIA) suspension 30 mL  30 mL Oral Daily PRN Nena Polio, PA-C      . metFORMIN (GLUCOPHAGE) tablet 1,000 mg  1,000 mg Oral BID WC Encarnacion Slates, NP   1,000 mg at 02/25/12 0838  . naproxen (NAPROSYN) tablet 500 mg  500 mg Oral TID WC Jimmye Norman, PA-C   500 mg at 02/25/12 1204  . QUEtiapine (SEROQUEL) tablet 400 mg  400 mg Oral QHS Darrol Jump, MD   400 mg at 02/24/12 2245  . tuberculin injection 5 Units  5 Units Intradermal Once Darrol Jump, MD   5 Units at 02/25/12 1344  . Vitamin D (Ergocalciferol) (DRISDOL) capsule 50,000 Units  50,000 Units Oral Q7 days Encarnacion Slates, NP        Lab Results:  Results for orders placed during the hospital encounter of 02/19/12 (from the past 48 hour(s))  GLUCOSE, CAPILLARY     Status: Abnormal   Collection Time   02/23/12  5:17 PM      Component  Value Range Comment   Glucose-Capillary 124 (*) 70 - 99 (mg/dL)   GLUCOSE, CAPILLARY     Status: Abnormal   Collection Time   02/23/12  9:27 PM      Component Value Range Comment   Glucose-Capillary 135 (*) 70 - 99 (mg/dL)   GLUCOSE, CAPILLARY     Status: Abnormal   Collection Time   02/24/12  6:21 AM      Component Value Range Comment   Glucose-Capillary 124 (*) 70 - 99 (mg/dL)   GLUCOSE, CAPILLARY     Status: Abnormal   Collection Time   02/24/12 11:55 AM      Component Value Range Comment   Glucose-Capillary 177 (*) 70 - 99 (mg/dL)    Comment 1 Notify RN     GLUCOSE, CAPILLARY     Status: Abnormal   Collection Time   02/24/12  5:08 PM      Component Value Range Comment   Glucose-Capillary 188 (*) 70 - 99 (mg/dL)    Comment 1 Notify RN     GLUCOSE, CAPILLARY     Status: Abnormal   Collection Time   02/24/12  9:16 PM      Component Value  Range Comment   Glucose-Capillary 137 (*) 70 - 99 (mg/dL)   GLUCOSE, CAPILLARY     Status: Abnormal   Collection Time   02/25/12  6:24 AM      Component Value Range Comment   Glucose-Capillary 133 (*) 70 - 99 (mg/dL)   GLUCOSE, CAPILLARY     Status: Abnormal   Collection Time   02/25/12 11:55 AM      Component Value Range Comment   Glucose-Capillary 271 (*) 70 - 99 (mg/dL)    Comment 1 Notify RN       Physical Findings: AIMS:  , ,  ,  ,    CIWA:    COWS:     Treatment Plan Summary: Daily contact with patient to assess and evaluate symptoms and progress in treatment Medication management  Discussion/Plan: [facility] to [facility] today.  Jose Conway 02/25/2012, 1:55 PM

## 2012-02-25 NOTE — Discharge Summary (Signed)
Physician Discharge Summary Note  Patient:  Jose Conway is an 51 y.o., male MRN:  [id number] DOB:  09-24-1961 Patient phone:  [phone] (home)  Patient address:   [id number],   Date of Admission:  02/19/2012 Date of Discharge: 02/25/12  Reason for Admission: Suicidal ideations and plan.  Discharge Diagnoses: Principal Problem:  *Schizoaffective disorder, depressive type Active Problems:  Cocaine dependence   Axis Diagnosis:   AXIS I:  Schizoaffective Disorder, Cocaine dependence AXIS II:  Deferred AXIS III:   Past Medical History  Diagnosis Date  . Depression   . Hypertension    AXIS IV:  economic problems, housing problems, occupational problems, other psychosocial or environmental problems and problems with primary support group AXIS V:  67  Level of Care:  OP  Hospital Course: This is a 51 year old African-American male, admitted from the [facility] ED with complaints of suicidal ideations and plans to jump in front of a moving truck, get crushed and or overdose on his medications. Patient reports, "I came to this hospital because of my depression, suicidal thoughts/plans and a little bit of drug addiction. I have been doing some drugs lately. I use crack cocaine. I feel very depressed because I am taking too much and too many pills. I don't know why I have to take too many pills to stay healthy. Why can't I just have one pill to take that will take care of my problems? I have diabetes and high blood pressure. The diabetes I have had for 10 years and it is not getting any better. 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 My legs, feet and fingers tingle, get numb and hurt all the time. I am going through some painful situation in my life. I don't get to do anything that I want, the way I want it and when I want it done. I am separated from my wife for 2 years and half years, but she is still stalking me.  She embarrasses me in front of people. I feel miserable. That is why I keep getting more and more depressed. I am homeless".  While a patient in this hospital, [name] received medication management for his mood disorder symptoms. He was ordered and received Cymbalta 60 mg for his depressive mood symptoms, Seroquel 400 mg for mood control/stabilization and Neurontin 600 mg for anxiety issues. He was also enrolled in group counseling and activities and participated in the substance abuse classes held within 300 hall. Beside his psychotropic medication management, Patient also received medication management and monitoring for his other medical conditions. He tolerated his treatment regimen without any adverse effects and or reactions.  [name] while here on daily basis reported improved mood and decreased suicidal ideations. He did maintain that to handle his substance abuse issues, he will need a much longer stay at an established residential treatment center. When he was in this hospital previously, patient was recommended and accepted [facility] [place], [place]. He later was unable to make this trip after discharge at that time. He was bent on going to this treatment center this time around, or all will not end well with him.  Patient attended treatment team meeting this am and stated that he is stable for discharge. He is currently being discharged to the [facility] in [place], [place]. He adamantly denies suicidal, homicidal ideations, auditory, visual  hallucinations and or delusional thinking. He left [facility] with all personal belongings in no apparent distress. Transportation to the treatment center in [place], [place] provided by [facility]. He received PPD skin test prior to discharge.  Consults:  None  Significant Diagnostic Studies:  labs: CBC, CMP, Toxicology tests, HGBA1C  Discharge Vitals:   Blood pressure  125/65, pulse 88, temperature 97 F (36.1 C), temperature source Oral, resp. rate 16, height 5\' 10"  (1.778 m), weight 97.07 kg (214 lb).  Mental Status Exam: See Mental Status Examination and Suicide Risk Assessment completed by Attending Physician prior to discharge.  Discharge destination: Residential treatment center in [place], [place]   Is patient on multiple antipsychotic therapies at discharge:  No   Has Patient had three or more failed trials of antipsychotic monotherapy by history:  No  Recommended Plan for Multiple Antipsychotic Therapies: NA   Medication List  As of 02/25/2012  4:31 PM   STOP taking these medications         FLUoxetine 20 MG tablet      hydrochlorothiazide 25 MG tablet      HYDROcodone-acetaminophen 10-500 MG per tablet      oxycodone 30 MG Tb12      zolpidem 10 MG tablet         TAKE these medications      Indication    amLODipine 10 MG tablet   Commonly known as: NORVASC   Take 1 tablet (10 mg total) by mouth every morning. For control of high blood pressure       DULoxetine 60 MG capsule   Commonly known as: CYMBALTA   Take 1 capsule (60 mg total) by mouth daily. For pain and mood management       gabapentin 300 MG capsule   Commonly known as: NEURONTIN   Take 2 capsules (600 mg total) by mouth 4 (four) times daily. For anxiety and pain management       metFORMIN 500 MG tablet   Commonly known as: GLUCOPHAGE   Take 2 tablets (1,000 mg total) by mouth 2 (two) times daily with a meal. For control of blood sugar       QUEtiapine 400 MG tablet   Commonly known as: SEROQUEL   Take 1 tablet (400 mg total) by mouth at bedtime. For insomnia.       Vitamin D (Ergocalciferol) 50000 UNITS Caps   Commonly known as: DRISDOL   Take 1 capsule (50,000 Units total) by mouth every 7 (seven) days. Takes on Tuesday mornings. For Vitamin D replacement and mood control.            Follow-up Information    [facility] up with [facility] on 02/26/2012.  (Patient scheduled for admission to [facility] on Tuesday, Feb 26, 2012)    Contact information:   [address] [place]  [address]  [phone]         Follow-up recommendations:  Activity:  as tolerated Other:  Keep all scheduled follow-up appointments as recommended.  Comments:  Take all your medications as prescribed. Report to your outpatient provider any adverse effects from medications promptly.  SignedLindell Spar I 02/25/2012, 4:31 PM

## 2012-02-25 NOTE — Progress Notes (Signed)
Slept well last nite, appetite is good, energy level is normal, ability to pay attention is good, depression 8/10 and hopeless 8/10, denies Si or Hi, walking in hallway and dayroom, attending group and interacting w/peers. q86min safety checks continue and support offered Safety maintained

## 2012-02-25 NOTE — Progress Notes (Signed)
PPD placed Left posterior forearm 5 units, wheal formed, no pain or discomfort. Educated patient on what to expect and will be ready for reading in 48-72 hours.

## 2012-02-25 NOTE — Progress Notes (Signed)
Patient alert and oriented, denies SI/HI.  Discharged to go to [facility] in [place].  Bus passes, sample medications, discharge instructions, prescriptions given to patient and patient verbalized understanding.  Contents of locker 114 given to patient.

## 2012-02-25 NOTE — Progress Notes (Signed)
[facility] Case Management Discharge Plan:  Will you be returning to the same living situation after discharge: No.  Patient to go for residential treatment at [facility] in [place], [place] At discharge, do you have transportation home?:Yes,  [facility] will arrange transportation for patient to come to their facility Do you have the ability to pay for your medications:Yes,  Patient has Medicare and Medicaid  Interagency Information:     Release of information consent forms completed and in the chart;  Patient's signature needed at discharge.  Patient to Follow up at:  [place]  [address]  Follow-up Information    Follow up with [facility].   Contact information:     [address], [place]         Patient denies SI/HI:   Yes.  Patient is not endorsing SI  Safety Planning and Suicide Prevention discussed:  Yes,  Reviewed during aftercare groups  Barrier to discharge identified:Yes,  No barriers identified  Summary and Recommendations: Patient encourage to be compliant with medications and follow up with outpatient recommedations    Carley Strickling, Eulas Post 02/25/2012, 11:19 AM

## 2012-02-25 NOTE — Progress Notes (Addendum)
[place] Group Notes: (Counselor/Nursing/MHT/Case Management/Adjunct) 02/25/2012   @  11:00am Overcoming Obstacles to Wellness   Type of Therapy:  Group Therapy  Participation Level:  None  Participation Quality:  None   Affect:  Blunted  Cognitive:  Appropriate  Insight:  None  Engagement in Group: None  Engagement in Therapy:  None  Modes of Intervention:  Support and Exploration  Summary of Progress/Problems: Jan slept through most of group  [name] 02/25/2012 2:38 PM      [place] Group Notes: (Counselor/Nursing/MHT/Case Management/Adjunct) 02/25/2012   @1 :15pm Breathing & Meditation for Anxiety/Anger   Type of Therapy:  Group Therapy  Participation Level:  Did Not Attend    [name] 02/25/2012  3:11 PM

## 2012-02-25 NOTE — Progress Notes (Signed)
Patient has been up and active on the unit. Patient received his hs medications and when writer inquired about if in any pain, patient reported that he is always in pain and was offered tylenol and heat pack but patient declined both. Patient reports that even his patch is not working. Patient was offered encouragement and Probation officer commented to patient that he seemed in better spirits today. Patient did not think so. Patient denies si/hi/a/v hall. Safety maintained, will continue to monitor.

## 2012-02-26 NOTE — Progress Notes (Signed)
[facility] Family/Significant Other Suicide Prevention Education  Suicide Prevention Education:  Patient Discharged to [place]:  Suicide Prevention Education Not Provided: [facility] [place]:  Vyron discharged to another healthcare facility for continuation of treatment.  The patient's medical information, including suicide ideations and risk factors, are a part of the medical information shared with the receiving healthcare facility.  Suicide prevention information was given to Jose Conway and a suicide prevention pamphlet was placed in his discharge packet.   [name] 02/25/2012   3:43pm

## 2012-02-26 NOTE — Discharge Summary (Signed)
I agree with this D/C Summary.  

## 2012-02-27 NOTE — Progress Notes (Signed)
Patient Discharge Instructions:  After Visit Summary (AVS):   Faxed to:  02/27/2012 Psychiatric Admission Assessment Note:   Faxed to:  02/27/2012 Suicide Risk Assessment - Discharge Assessment:   Faxed to:  02/27/2012 Faxed/Sent to the Next Level Care provider:  02/27/2012  Faxed to [facility] @ [phone]  Jola Baptist, 02/27/2012, 5:19 PM

## 2016-11-14 ENCOUNTER — Encounter (HOSPITAL_COMMUNITY): Payer: Self-pay

## 2016-11-14 DIAGNOSIS — E1140 Type 2 diabetes mellitus with diabetic neuropathy, unspecified: Secondary | ICD-10-CM | POA: Diagnosis present

## 2016-11-14 DIAGNOSIS — E1151 Type 2 diabetes mellitus with diabetic peripheral angiopathy without gangrene: Secondary | ICD-10-CM | POA: Diagnosis not present

## 2016-11-14 DIAGNOSIS — Z79899 Other long term (current) drug therapy: Secondary | ICD-10-CM

## 2016-11-14 DIAGNOSIS — I129 Hypertensive chronic kidney disease with stage 1 through stage 4 chronic kidney disease, or unspecified chronic kidney disease: Secondary | ICD-10-CM | POA: Diagnosis present

## 2016-11-14 DIAGNOSIS — E11621 Type 2 diabetes mellitus with foot ulcer: Secondary | ICD-10-CM | POA: Diagnosis present

## 2016-11-14 DIAGNOSIS — L97519 Non-pressure chronic ulcer of other part of right foot with unspecified severity: Secondary | ICD-10-CM | POA: Diagnosis present

## 2016-11-14 DIAGNOSIS — F329 Major depressive disorder, single episode, unspecified: Secondary | ICD-10-CM | POA: Diagnosis present

## 2016-11-14 DIAGNOSIS — Z7984 Long term (current) use of oral hypoglycemic drugs: Secondary | ICD-10-CM

## 2016-11-14 DIAGNOSIS — N179 Acute kidney failure, unspecified: Secondary | ICD-10-CM | POA: Diagnosis present

## 2016-11-14 DIAGNOSIS — E1169 Type 2 diabetes mellitus with other specified complication: Secondary | ICD-10-CM | POA: Diagnosis present

## 2016-11-14 DIAGNOSIS — M869 Osteomyelitis, unspecified: Secondary | ICD-10-CM | POA: Diagnosis present

## 2016-11-14 DIAGNOSIS — Z8249 Family history of ischemic heart disease and other diseases of the circulatory system: Secondary | ICD-10-CM

## 2016-11-14 DIAGNOSIS — D509 Iron deficiency anemia, unspecified: Secondary | ICD-10-CM | POA: Diagnosis not present

## 2016-11-14 DIAGNOSIS — F17210 Nicotine dependence, cigarettes, uncomplicated: Secondary | ICD-10-CM | POA: Diagnosis present

## 2016-11-14 DIAGNOSIS — E1122 Type 2 diabetes mellitus with diabetic chronic kidney disease: Secondary | ICD-10-CM | POA: Diagnosis present

## 2016-11-14 DIAGNOSIS — Z833 Family history of diabetes mellitus: Secondary | ICD-10-CM

## 2016-11-14 DIAGNOSIS — N189 Chronic kidney disease, unspecified: Secondary | ICD-10-CM | POA: Clinically undetermined

## 2016-11-14 DIAGNOSIS — L0390 Cellulitis, unspecified: Secondary | ICD-10-CM | POA: Diagnosis present

## 2016-11-14 NOTE — ED Triage Notes (Signed)
Patient came in with complain of leg swelling for about a month. Pt denies pain. Pt state he been taking medication for it but it seems not to work. No redness or warm noted.

## 2016-11-15 ENCOUNTER — Inpatient Hospital Stay (HOSPITAL_COMMUNITY)
Admission: EM | Admit: 2016-11-15 | Discharge: 2016-11-16 | DRG: 300 | Discharge status: Against Medical Advice | Payer: Medicare Other | Attending: Internal Medicine | Admitting: Internal Medicine

## 2016-11-15 ENCOUNTER — Inpatient Hospital Stay (HOSPITAL_COMMUNITY): Payer: Medicare Other

## 2016-11-15 ENCOUNTER — Emergency Department (HOSPITAL_COMMUNITY): Payer: Medicare Other

## 2016-11-15 DIAGNOSIS — E1151 Type 2 diabetes mellitus with diabetic peripheral angiopathy without gangrene: Secondary | ICD-10-CM | POA: Diagnosis present

## 2016-11-15 DIAGNOSIS — E119 Type 2 diabetes mellitus without complications: Secondary | ICD-10-CM

## 2016-11-15 DIAGNOSIS — D509 Iron deficiency anemia, unspecified: Secondary | ICD-10-CM | POA: Diagnosis present

## 2016-11-15 DIAGNOSIS — M7989 Other specified soft tissue disorders: Secondary | ICD-10-CM

## 2016-11-15 DIAGNOSIS — E1140 Type 2 diabetes mellitus with diabetic neuropathy, unspecified: Secondary | ICD-10-CM | POA: Diagnosis present

## 2016-11-15 DIAGNOSIS — E1159 Type 2 diabetes mellitus with other circulatory complications: Secondary | ICD-10-CM | POA: Diagnosis not present

## 2016-11-15 DIAGNOSIS — E1122 Type 2 diabetes mellitus with diabetic chronic kidney disease: Secondary | ICD-10-CM | POA: Diagnosis present

## 2016-11-15 DIAGNOSIS — Z79899 Other long term (current) drug therapy: Secondary | ICD-10-CM | POA: Diagnosis not present

## 2016-11-15 DIAGNOSIS — N179 Acute kidney failure, unspecified: Secondary | ICD-10-CM

## 2016-11-15 DIAGNOSIS — L0390 Cellulitis, unspecified: Secondary | ICD-10-CM | POA: Diagnosis present

## 2016-11-15 DIAGNOSIS — L97519 Non-pressure chronic ulcer of other part of right foot with unspecified severity: Secondary | ICD-10-CM | POA: Diagnosis present

## 2016-11-15 DIAGNOSIS — M869 Osteomyelitis, unspecified: Secondary | ICD-10-CM

## 2016-11-15 DIAGNOSIS — N189 Chronic kidney disease, unspecified: Secondary | ICD-10-CM | POA: Diagnosis not present

## 2016-11-15 DIAGNOSIS — F329 Major depressive disorder, single episode, unspecified: Secondary | ICD-10-CM | POA: Diagnosis present

## 2016-11-15 DIAGNOSIS — I10 Essential (primary) hypertension: Secondary | ICD-10-CM

## 2016-11-15 DIAGNOSIS — I43 Cardiomyopathy in diseases classified elsewhere: Secondary | ICD-10-CM

## 2016-11-15 DIAGNOSIS — D649 Anemia, unspecified: Secondary | ICD-10-CM | POA: Diagnosis not present

## 2016-11-15 DIAGNOSIS — Z833 Family history of diabetes mellitus: Secondary | ICD-10-CM | POA: Diagnosis not present

## 2016-11-15 DIAGNOSIS — F17210 Nicotine dependence, cigarettes, uncomplicated: Secondary | ICD-10-CM | POA: Diagnosis present

## 2016-11-15 DIAGNOSIS — I129 Hypertensive chronic kidney disease with stage 1 through stage 4 chronic kidney disease, or unspecified chronic kidney disease: Secondary | ICD-10-CM | POA: Diagnosis present

## 2016-11-15 DIAGNOSIS — E11621 Type 2 diabetes mellitus with foot ulcer: Secondary | ICD-10-CM | POA: Diagnosis present

## 2016-11-15 DIAGNOSIS — Z8249 Family history of ischemic heart disease and other diseases of the circulatory system: Secondary | ICD-10-CM | POA: Diagnosis not present

## 2016-11-15 DIAGNOSIS — Z7984 Long term (current) use of oral hypoglycemic drugs: Secondary | ICD-10-CM | POA: Diagnosis not present

## 2016-11-15 DIAGNOSIS — E1169 Type 2 diabetes mellitus with other specified complication: Secondary | ICD-10-CM | POA: Diagnosis present

## 2016-11-15 DIAGNOSIS — I70235 Atherosclerosis of native arteries of right leg with ulceration of other part of foot: Secondary | ICD-10-CM | POA: Diagnosis not present

## 2016-11-15 DIAGNOSIS — N289 Disorder of kidney and ureter, unspecified: Secondary | ICD-10-CM

## 2016-11-15 HISTORY — DX: Type 2 diabetes mellitus without complications: E11.9

## 2016-11-15 LAB — CBC WITH DIFFERENTIAL/PLATELET
BASOS PCT: 0 %
Basophils Absolute: 0 10*3/uL (ref 0.0–0.1)
Eosinophils Absolute: 0.2 10*3/uL (ref 0.0–0.7)
Eosinophils Relative: 2 %
HCT: 21.6 % — ABNORMAL LOW (ref 39.0–52.0)
Hemoglobin: 6.9 g/dL — CL (ref 13.0–17.0)
Lymphocytes Relative: 16 %
Lymphs Abs: 1.9 10*3/uL (ref 0.7–4.0)
MCH: 24 pg — AB (ref 26.0–34.0)
MCHC: 31.9 g/dL (ref 30.0–36.0)
MCV: 75 fL — ABNORMAL LOW (ref 78.0–100.0)
Monocytes Absolute: 0.9 10*3/uL (ref 0.1–1.0)
Monocytes Relative: 8 %
NEUTROS PCT: 74 %
Neutro Abs: 8.8 10*3/uL — ABNORMAL HIGH (ref 1.7–7.7)
PLATELETS: 380 10*3/uL (ref 150–400)
RBC: 2.88 MIL/uL — ABNORMAL LOW (ref 4.22–5.81)
RDW: 15.4 % (ref 11.5–15.5)
WBC: 11.8 10*3/uL — AB (ref 4.0–10.5)

## 2016-11-15 LAB — RAPID URINE DRUG SCREEN, HOSP PERFORMED
Amphetamines: NOT DETECTED
Barbiturates: NOT DETECTED
Benzodiazepines: NOT DETECTED
COCAINE: NOT DETECTED
Opiates: NOT DETECTED
TETRAHYDROCANNABINOL: NOT DETECTED

## 2016-11-15 LAB — VITAMIN B12: Vitamin B-12: 216 pg/mL (ref 180–914)

## 2016-11-15 LAB — PREPARE RBC (CROSSMATCH)

## 2016-11-15 LAB — COMPREHENSIVE METABOLIC PANEL
ALK PHOS: 76 U/L (ref 38–126)
ALT: 8 U/L — AB (ref 17–63)
AST: 12 U/L — ABNORMAL LOW (ref 15–41)
Albumin: 2.6 g/dL — ABNORMAL LOW (ref 3.5–5.0)
Anion gap: 9 (ref 5–15)
BUN: 17 mg/dL (ref 6–20)
CALCIUM: 8.3 mg/dL — AB (ref 8.9–10.3)
CO2: 25 mmol/L (ref 22–32)
Chloride: 104 mmol/L (ref 101–111)
Creatinine, Ser: 2.07 mg/dL — ABNORMAL HIGH (ref 0.61–1.24)
GFR, EST AFRICAN AMERICAN: 40 mL/min — AB (ref >60)
GFR, EST NON AFRICAN AMERICAN: 35 mL/min — AB (ref >60)
Glucose, Bld: 95 mg/dL (ref 65–99)
Potassium: 3.9 mmol/L (ref 3.5–5.1)
Sodium: 138 mmol/L (ref 135–145)
Total Bilirubin: 0.4 mg/dL (ref 0.3–1.2)
Total Protein: 7.8 g/dL (ref 6.5–8.1)

## 2016-11-15 LAB — GLUCOSE, CAPILLARY
GLUCOSE-CAPILLARY: 118 mg/dL — AB (ref 65–99)
GLUCOSE-CAPILLARY: 123 mg/dL — AB (ref 65–99)
GLUCOSE-CAPILLARY: 151 mg/dL — AB (ref 65–99)
Glucose-Capillary: 108 mg/dL — ABNORMAL HIGH (ref 65–99)

## 2016-11-15 LAB — RETICULOCYTES
RBC.: 2.24 MIL/uL — AB (ref 4.22–5.81)
RETIC CT PCT: 2.1 % (ref 0.4–3.1)
Retic Count, Absolute: 47 10*3/uL (ref 19.0–186.0)

## 2016-11-15 LAB — FERRITIN: FERRITIN: 66 ng/mL (ref 24–336)

## 2016-11-15 LAB — IRON AND TIBC
IRON: 15 ug/dL — AB (ref 45–182)
Saturation Ratios: 8 % — ABNORMAL LOW (ref 17.9–39.5)
TIBC: 182 ug/dL — ABNORMAL LOW (ref 250–450)
UIBC: 167 ug/dL

## 2016-11-15 LAB — ABO/RH: ABO/RH(D): A POS

## 2016-11-15 LAB — POC OCCULT BLOOD, ED: FECAL OCCULT BLD: NEGATIVE

## 2016-11-15 LAB — FOLATE: Folate: 4.7 ng/mL — ABNORMAL LOW (ref >5.9)

## 2016-11-15 LAB — SEDIMENTATION RATE: Sed Rate: 138 mm/hr — ABNORMAL HIGH (ref 0–16)

## 2016-11-15 MED ORDER — ENOXAPARIN SODIUM 100 MG/ML ~~LOC~~ SOLN
100.0000 mg | Freq: Once | SUBCUTANEOUS | Status: AC
  Administered 2016-11-15: 100 mg via SUBCUTANEOUS
  Filled 2016-11-15: qty 1

## 2016-11-15 MED ORDER — SODIUM CHLORIDE 0.9 % IV SOLN
10.0000 mL/h | Freq: Once | INTRAVENOUS | Status: AC
  Administered 2016-11-15: 10 mL/h via INTRAVENOUS

## 2016-11-15 MED ORDER — HYDRALAZINE HCL 20 MG/ML IJ SOLN
10.0000 mg | Freq: Once | INTRAMUSCULAR | Status: AC
  Administered 2016-11-15: 05:00:00 10 mg via INTRAVENOUS
  Filled 2016-11-15: qty 1

## 2016-11-15 MED ORDER — INSULIN ASPART 100 UNIT/ML ~~LOC~~ SOLN
0.0000 [IU] | Freq: Three times a day (TID) | SUBCUTANEOUS | Status: DC
  Administered 2016-11-15: 3 [IU] via SUBCUTANEOUS

## 2016-11-15 MED ORDER — ACETAMINOPHEN 650 MG RE SUPP: 650.0000 mg | Freq: Four times a day (QID) | RECTAL | Status: DC | PRN

## 2016-11-15 MED ORDER — AMLODIPINE BESYLATE 10 MG PO TABS
10.0000 mg | ORAL_TABLET | Freq: Every morning | ORAL | Status: DC
  Administered 2016-11-15: 10 mg via ORAL
  Filled 2016-11-15: qty 1

## 2016-11-15 MED ORDER — INSULIN ASPART 100 UNIT/ML ~~LOC~~ SOLN
0.0000 [IU] | Freq: Every day | SUBCUTANEOUS | Status: DC
Start: 2016-11-15 — End: 2016-11-16

## 2016-11-15 MED ORDER — VANCOMYCIN HCL IN DEXTROSE 750-5 MG/150ML-% IV SOLN
750.0000 mg | Freq: Two times a day (BID) | INTRAVENOUS | Status: DC
  Administered 2016-11-15 (×2): 750 mg via INTRAVENOUS
  Filled 2016-11-15 (×3): qty 150

## 2016-11-15 MED ORDER — HYDRALAZINE HCL 25 MG PO TABS: 25.0000 mg | ORAL_TABLET | Freq: Four times a day (QID) | ORAL | Status: DC

## 2016-11-15 MED ORDER — SODIUM CHLORIDE 0.9 % IV SOLN
INTRAVENOUS | Status: DC
  Administered 2016-11-15: 10:00:00 via INTRAVENOUS

## 2016-11-15 MED ORDER — ACETAMINOPHEN 325 MG PO TABS: 650.0000 mg | ORAL_TABLET | Freq: Four times a day (QID) | ORAL | Status: DC | PRN

## 2016-11-15 MED ORDER — HYDROCODONE-ACETAMINOPHEN 5-325 MG PO TABS: 1.0000 | ORAL_TABLET | ORAL | Status: DC | PRN

## 2016-11-15 MED ORDER — HYDRALAZINE HCL 20 MG/ML IJ SOLN
10.0000 mg | INTRAMUSCULAR | Status: DC | PRN
  Administered 2016-11-15: 11:00:00 10 mg via INTRAVENOUS
  Filled 2016-11-15: qty 1

## 2016-11-15 MED ORDER — PIPERACILLIN-TAZOBACTAM 3.375 G IVPB
3.3750 g | Freq: Three times a day (TID) | INTRAVENOUS | Status: DC
  Administered 2016-11-15 – 2016-11-16 (×3): 3.375 g via INTRAVENOUS
  Filled 2016-11-15 (×4): qty 50

## 2016-11-15 MED ORDER — HEPARIN SODIUM (PORCINE) 5000 UNIT/ML IJ SOLN
5000.0000 [IU] | Freq: Three times a day (TID) | INTRAMUSCULAR | Status: DC
  Administered 2016-11-16: 5000 [IU] via SUBCUTANEOUS
  Filled 2016-11-15: qty 1

## 2016-11-15 MED ORDER — VANCOMYCIN HCL IN DEXTROSE 1-5 GM/200ML-% IV SOLN
1000.0000 mg | Freq: Once | INTRAVENOUS | Status: AC
  Administered 2016-11-15: 1000 mg via INTRAVENOUS
  Filled 2016-11-15: qty 200

## 2016-11-15 MED ORDER — HYDRALAZINE HCL 25 MG PO TABS
25.0000 mg | ORAL_TABLET | Freq: Three times a day (TID) | ORAL | Status: DC
  Administered 2016-11-16 (×2): 25 mg via ORAL
  Filled 2016-11-15 (×2): qty 1

## 2016-11-15 NOTE — ED Notes (Addendum)
Pt did not want to put hospital gown on.

## 2016-11-15 NOTE — Progress Notes (Addendum)
Pharmacy Antibiotic Note  Jose Conway is a 56 y.o. male admitted on 11/15/2016 with DM presents with R foot wound and likely osteomyelitis per Xray.  Pharmacy has been consulted for vancomycin and zosyn dosing.  Today, 11/15/2016  Renal: SCr elevated - unknown if CKD vs AKI  Afebrile  WBC mildly elevated  Plan:  Vancomycin 750mg  IV q12h for goal trough 15-20 mcg/ml  Check trough at steady state  Zosyn 3.375gm IV q8h over 4h infusion  Check SCr at least q72h while on vancomycin  Data suggests increase risk of nephrotoxicity w/ combination of zosyn and vancomycin   Follow-up ability to de-escalate   Height: 6' (182.9 cm) Weight: 214 lb (97.1 kg) IBW/kg (Calculated) : 77.6  Temp (24hrs), Avg:98.6 F (37 C), Min:98.5 F (36.9 C), Max:98.7 F (37.1 C)   Recent Labs Lab 11/15/16 0128  WBC 11.8*  CREATININE 2.07*    Estimated Creatinine Clearance (by C-G formula based on SCr of 2.07 mg/dL (H)) Male: 40.6 mL/min Male: 49.3 mL/min    No Known Allergies  Antimicrobials this admission: 2/1 vancomycin >> 2/1 zosyn >>  Dose adjustments this admission:  Microbiology results: none  Thank you for allowing pharmacy to be a part of this patient's care.  Doreene Eland, PharmD, BCPS.   Pager: [phone] 11/15/2016 7:21 AM

## 2016-11-15 NOTE — H&P (Addendum)
TRH H&P   Patient Demographics:    Jose Conway, is a 56 y.o. male  MRN: [id number]   DOB - 1961/05/02  Admit Date - 11/15/2016  Outpatient Primary MD for the patient is Default, Provider, MD  Referring MD/NP/PA: Dr Roxanne Mins  Patient coming from: home  Chief Complaint  Patient presents with  . Leg Swelling      HPI:    Jose Conway  is a 56 y.o. male, With past medical history of diabetes mellitus, hypertension, presents to ED with complaints of right leg swelling, and right great toe ulceration on discharge, reports he had ulceration and wound for last couple months, but he started to notice foul-smelling odor for last 2 weeks, which prompted him to come to ED, he denies fever, chills, shortness of breath, nausea vomiting, coffee-ground emesis, melena or bright red blood per rectum. In ED Significant for a creatinine of 2, foot x-ray significant for osteomyelitis, and has hemoglobin of 6.9, his Hemoccult stool is negative.    Review of systems:    In addition to the HPI above, No Fever-chills, No Headache, No changes with Vision or hearing, No problems swallowing food or Liquids, No Chest pain, Cough or Shortness of Breath, No Abdominal pain, No Nausea or Vommitting, Bowel movements are regular, No Blood in stool or Urine, No dysuria, Ports worsening right great toe discharge, with foul smelling odor. Reports right foot swelling No new weakness, tingling, numbness in any extremity, report generalized weakness No recent weight gain or loss, No polyuria, polydypsia or polyphagia, No significant Mental Stressors.  A full 10 point Review of Systems was done, except as stated above, all other Review of Systems were negative.   With Past History of the following :    Past Medical History:  Diagnosis Date  . Depression   . Diabetes mellitus without complication ([place])     . Hypertension       History reviewed. No pertinent surgical history.    Social History:     Social History  Substance Use Topics  . Smoking status: Current Every Day Smoker    Packs/day: 0.50    Types: Cigarettes  . Smokeless tobacco: Never Used  . Alcohol use Yes     Lives - at home  Mobility - independent     Family History :   Family history significant for diabetes mellitus and hypertension   Home Medications:   Prior to Admission medications   Medication Sig Start Date End Date Taking? Authorizing Provider  amLODipine (NORVASC) 10 MG tablet Take 1 tablet (10 mg total) by mouth every morning. For control of high blood pressure 02/25/12   Darrol Jump, MD  DULoxetine (CYMBALTA) 60 MG capsule Take 1 capsule (60 mg total) by mouth daily. For pain and mood management 02/25/12 02/24/13  Darrol Jump, MD  metFORMIN (GLUCOPHAGE) 500 MG tablet Take 2 tablets (  1,000 mg total) by mouth 2 (two) times daily with a meal. For control of blood sugar 02/25/12   Darrol Jump, MD  QUEtiapine (SEROQUEL) 400 MG tablet Take 1 tablet (400 mg total) by mouth at bedtime. For insomnia. 02/25/12 03/26/12  Darrol Jump, MD  Vitamin D, Ergocalciferol, (DRISDOL) 50000 UNITS CAPS Take 1 capsule (50,000 Units total) by mouth every 7 (seven) days. Takes on Tuesday mornings. For Vitamin D replacement and mood control. 02/25/12   Darrol Jump, MD     Allergies:    No Known Allergies   Physical Exam:   Vitals  Blood pressure (!) 165/70, pulse 92, temperature 98.6 F (37 C), temperature source Oral, resp. rate 16, height 6' (1.829 m), weight 97.1 kg (214 lb), SpO2 98 %.   1. General Well-developed male lying in bed in NAD,   2. Normal affect and insight, Not Suicidal or Homicidal, Awake Alert, Oriented X 3.  3. No F.N deficits, ALL C.Nerves Intact, Strength 5/5 all 4 extremities, Sensation intact all 4 extremities, Plantars down going.  4. Ears and Eyes appear Normal, Conjunctivae  clear, PERRLA. Moist Oral Mucosa.  5. Supple Neck, No JVD, No cervical lymphadenopathy appriciated, No Carotid Bruits.  6. Symmetrical Chest wall movement, Good air movement bilaterally, CTAB.  7. RRR, No Gallops, Rubs or Murmurs, No Parasternal Heave.  8. Positive Bowel Sounds, Abdomen Soft, No tenderness, No organomegaly appriciated,No rebound -guarding or rigidity.  9.  No Cyanosis, Normal Skin Turgor,   10. Good muscle tone,  joints appear normal , no effusions, Right great toe with foul smelling discharge, and ulceration  11. No Palpable Lymph Nodes in Neck or Axillae     Data Review:    CBC  Recent Labs Lab 11/15/16 0128  WBC 11.8*  HGB 6.9*  HCT 21.6*  PLT 380  MCV 75.0*  MCH 24.0*  MCHC 31.9  RDW 15.4  LYMPHSABS 1.9  MONOABS 0.9  EOSABS 0.2  BASOSABS 0.0   ------------------------------------------------------------------------------------------------------------------  Chemistries   Recent Labs Lab 11/15/16 0128  NA 138  K 3.9  CL 104  CO2 25  GLUCOSE 95  BUN 17  CREATININE 2.07*  CALCIUM 8.3*  AST 12*  ALT 8*  ALKPHOS 76  BILITOT 0.4   ------------------------------------------------------------------------------------------------------------------ estimated creatinine clearance is 40.6 mL/min for male patients and 49.3 mL/min for male patients (by C-G formula based on SCr of 2.07 mg/dL (H)). ------------------------------------------------------------------------------------------------------------------ No results for input(s): TSH, T4TOTAL, T3FREE, THYROIDAB in the last 72 hours.  Invalid input(s): FREET3  Coagulation profile No results for input(s): INR, PROTIME in the last 168 hours. ------------------------------------------------------------------------------------------------------------------- No results for input(s): DDIMER in the last 72  hours. -------------------------------------------------------------------------------------------------------------------  Cardiac Enzymes No results for input(s): CKMB, TROPONINI, MYOGLOBIN in the last 168 hours.  Invalid input(s): CK ------------------------------------------------------------------------------------------------------------------ No results found for: BNP   ---------------------------------------------------------------------------------------------------------------  Urinalysis    Component Value Date/Time   COLORURINE YELLOW 07/18/2011 1503   APPEARANCEUR CLEAR 07/18/2011 1503   LABSPEC 1.028 07/18/2011 1503   PHURINE 6.0 07/18/2011 1503   GLUCOSEU >1000 (A) 07/18/2011 1503   HGBUR LARGE (A) 07/18/2011 1503   BILIRUBINUR NEGATIVE 07/18/2011 1503   KETONESUR NEGATIVE 07/18/2011 1503   PROTEINUR >300 (A) 07/18/2011 1503   UROBILINOGEN 1.0 07/18/2011 1503   NITRITE NEGATIVE 07/18/2011 1503   LEUKOCYTESUR NEGATIVE 07/18/2011 1503    ----------------------------------------------------------------------------------------------------------------   Imaging Results:    Dg Foot Complete Right  Result Date: 11/15/2016 CLINICAL DATA:  Swelling and drainage from the first  and second toes without known trauma. EXAM: RIGHT FOOT COMPLETE - 3+ VIEW COMPARISON:  None. FINDINGS: Severe bony destruction of the proximal and distal phalanges of the first digit with sclerosis of the adjacent bone. There is bony fragmentation. There is pathologic fracture in the proximal phalanx. Bony destruction of the second middle phalanx. Probable second DIP dislocation. The bony changes likely represents osteomyelitis. No soft tissue gas.  Marked soft tissue swelling. IMPRESSION: Prominent bony destruction of the first digit proximal and distal phalanges, and second digit middle phalanx, consistent with osteomyelitis. Pathologic fracture of the first proximal phalanx. Probable dislocation at  the second DIP. Metatarsal heads appear intact. Electronically Signed   By: Andreas Newport M.D.   On: 11/15/2016 02:04     Assessment & Plan:    Active Problems:   Osteomyelitis of toe of right foot (HCC)   Osteomyelitis (HCC)   Diabetes ([place])   Anemia   AKI (acute kidney injury) ([place])   HTN (hypertension)  First and second toe osteomyelitis - Patient with great toe ulcer for few month now, currently with foul-smelling odor,  x-ray with evidence of osteomyelitis, will start empirically on vancomycin and Zosyn, will check ABI to evaluate for circulation, we'll follow blood cultures, Dr. Sharol Given consulted for further evaluation. - Received full dose Lovenox in ED empirically out of concern for DVT, venous Doppler negative for DVT, will change to prophylaxis dose.  Anemia - Unclear what is patient's baseline, presents with hemoglobin of 6.9, will use 2 units PRBC, Hemoccult is negative, he denies any bleed. - We'll check anemia panel.  AKI - No recent labs for few years, presents with a creatinine of 2, unclear if acute or chronic, will continue gentle hydration, hold nephrotoxic medication, and recheck in BMP a.m. - Continue to hold lisinopril  Diabetes mellitus - Continue to hold metformin, start on insulin sliding scale, check hemoglobin A1c  Hypertension - Blood pressure control, continue with Norvasc, and when necessary hydralazine  Follow on urine drug screen   DVT Prophylaxis Heparin -   SCDs  AM Labs Ordered, also please review Full Orders  Family Communication: Admission, patients condition and plan of care including tests being ordered have been discussed with the patient  who indicate understanding and agree with the plan and Code Status.  Code Status Full  Likely DC to  Home  Condition GUARDED    Consults called: Dr Sharol Given  Admission status: inpatient   Time spent in minutes : 65 minutes   Braelee Herrle M.D on 11/15/2016 at 8:58 AM  Between 7am to 7pm -  Pager - [phone]. After 7pm go to [email or web address] - password [facility]  [facility] - Office  [phone]

## 2016-11-15 NOTE — Consult Note (Signed)
ORTHOPAEDIC CONSULTATION  REQUESTING PHYSICIAN: Jose Patricia, MD  Chief Complaint: Ulceration swelling right foot great toe and second toe.  HPI: Jose Conway is a 56 y.o. male who presents with chronic history of swelling pain and drainage from the right great toe and right second toe. Patient also has a history of diabetes, hypertension, as well as tobacco use.  Past Medical History:  Diagnosis Date  . Depression   . Diabetes mellitus without complication ([place])   . Hypertension    History reviewed. No pertinent surgical history. Social History   Social History  . Marital status: Legally Separated    Spouse name: N/A  . Number of children: N/A  . Years of education: N/A   Social History Main Topics  . Smoking status: Current Every Day Smoker    Packs/day: 0.50    Types: Cigarettes  . Smokeless tobacco: Never Used  . Alcohol use Yes  . Drug use: No  . Sexual activity: Not Asked   Other Topics Concern  . None   Social History Narrative  . None   No family history on file. - negative except otherwise stated in the family history section No Known Allergies Prior to Admission medications   Medication Sig Start Date End Date Taking? Authorizing Conway  amLODipine (NORVASC) 10 MG tablet Take 1 tablet (10 mg total) by mouth every morning. For control of high blood pressure 02/25/12  Yes Jose Jump, MD  DULoxetine (CYMBALTA) 60 MG capsule Take 1 capsule (60 mg total) by mouth daily. For pain and mood management 02/25/12 11/15/16 Yes Jose Jump, MD  lisinopril (PRINIVIL,ZESTRIL) 20 MG tablet Take 20 mg by mouth daily.   Yes Jose Provider, MD  metFORMIN (GLUCOPHAGE) 500 MG tablet Take 2 tablets (1,000 mg total) by mouth 2 (two) times daily with a meal. For control of blood sugar 02/25/12  Yes Jose Jump, MD  QUEtiapine (SEROQUEL) 400 MG tablet Take 1 tablet (400 mg total) by mouth at bedtime. For insomnia. 02/25/12 11/15/16 Yes Jose Jump, MD    Vitamin D, Ergocalciferol, (DRISDOL) 50000 UNITS CAPS Take 1 capsule (50,000 Units total) by mouth every 7 (seven) days. Takes on Tuesday mornings. For Vitamin D replacement and mood control. 02/25/12  Yes Jose Jump, MD   Dg Foot Complete Right  Result Date: 11/15/2016 CLINICAL DATA:  Swelling and drainage from the first and second toes without known trauma. EXAM: RIGHT FOOT COMPLETE - 3+ VIEW COMPARISON:  None. FINDINGS: Severe bony destruction of the proximal and distal phalanges of the first digit with sclerosis of the adjacent bone. There is bony fragmentation. There is pathologic fracture in the proximal phalanx. Bony destruction of the second middle phalanx. Probable second DIP dislocation. The bony changes likely represents osteomyelitis. No soft tissue gas.  Marked soft tissue swelling. IMPRESSION: Prominent bony destruction of the first digit proximal and distal phalanges, and second digit middle phalanx, consistent with osteomyelitis. Pathologic fracture of the first proximal phalanx. Probable dislocation at the second DIP. Metatarsal heads appear intact. Electronically Signed   By: Jose Conway M.D.   On: 11/15/2016 02:04   - pertinent xrays, CT, MRI studies were reviewed and independently interpreted  Positive ROS: All other systems have been reviewed and were otherwise negative with the exception of those mentioned in the HPI and as above.  Physical Exam: General: Alert, no acute distress Psychiatric: Patient is competent for consent with normal mood and affect Lymphatic: No axillary or cervical  lymphadenopathy Cardiovascular: No pedal edema Respiratory: No cyanosis, no use of accessory musculature GI: No organomegaly, abdomen is soft and non-tender  Skin: Examination patient has ulceration and swelling of the great toe and second toe with sausage digit swelling right foot.   Neurologic: Patient does not have protective sensation bilateral lower  extremities.   MUSCULOSKELETAL:  Review the radiographs shows destructive bony changes of the great toe and second toe right foot consistent with chronic osteomyelitis. Patient has calcification of the digital vessels consistent with peripheral vascular disease with his history of diabetes hypertension and smoking. Patient has a palpable dorsalis pedis pulse on the left and a faint palpable dorsalis pedis pulse on the right.  Assessment: Assessment: Diabetic insensate neuropathy with hypertension tobacco use and chronic osteomyelitis with ulceration cellulitis and swelling of the right great toe and second toe with peripheral vascular disease.  Plan: Plan: We will obtain ankle-brachial indices. If patient has significant change in the ankle-brachial indices on the right would recommend vascular surgery evaluation for possible arterial study. If arterial flow is adequate in the right lower extremity would plan for amputation of the great toe and second toe. With patient's severe peripheral vascular disease he is an increased risk of the surgical incision not healing.  Thank you for the consult and the opportunity to see [name], MD [facility] [phone] 5:41 PM

## 2016-11-15 NOTE — Progress Notes (Signed)
VASCULAR LAB PRELIMINARY  ARTERIAL  ABI completed: Right ABI of 0.73 is suggestive of moderate arterial occlusive disease at rest. Left ABI of 0.83 is suggestive of mild arterial occlusive disease at rest.   RIGHT    LEFT    PRESSURE WAVEFORM  PRESSURE WAVEFORM  BRACHIAL 190 Triphasic BRACHIAL 186 Triphasic  DP 139 Biphasic DP 158 Biphasic  PT 131 Biphasic PT 153 Biphasic  GREAT TOE  NA GREAT TOE 94 NA    RIGHT LEFT  ABI 0.73 0.83     Legrand Como, RVT 11/15/2016, 10:00 AM

## 2016-11-15 NOTE — Progress Notes (Signed)
**  Preliminary report by tech**  Right lower extremity venous duplex complete. There is no evidence of deep or superficial vein thrombosis involving the right lower extremity. All visualized vessels appear patent and compressible. There is no evidence of a Baker's cyst on the right. Results were given to Dr. Waldron Labs.  11/15/16 8:45 AM Jose Conway RVT

## 2016-11-15 NOTE — ED Provider Notes (Signed)
[facility] DEPT Provider Note   CSN: [id number] Arrival date & time: 11/14/16  2157   By signing my name below, I, Delton Prairie, attest that this documentation has been prepared under the direction and in the presence of Delora Fuel, MD  Electronically Signed: Delton Prairie, ED Scribe. 11/15/16. 1:25 AM.   History   Chief Complaint Chief Complaint  Patient presents with  . Leg Swelling    The history is provided by the patient. No language interpreter was used.   HPI Comments:  Jose Conway is a 56 y.o. male, with a hx of DM and HTN, who presents to the Emergency Department complaining of right lower leg swelling and right big toe swelling onset 3 weeks. He also reports a mal odor from the affected area. He has applied gauze with no relief. He states he currently is on lisinopril, metformin and notes he takes 8 pills a day. Pt denies SOB, fevers, chills, diaphoresis and numbness. No other associated symptoms noted.    Past Medical History:  Diagnosis Date  . Depression   . Diabetes mellitus without complication ([place])   . Hypertension     Patient Active Problem List   Diagnosis Date Noted  . Cocaine dependence 02/20/2012  . Schizoaffective disorder, depressive type ([place]) 02/20/2012    Class: Acute    History reviewed. No pertinent surgical history.     Home Medications    Prior to Admission medications   Medication Sig Start Date End Date Taking? Authorizing Provider  amLODipine (NORVASC) 10 MG tablet Take 1 tablet (10 mg total) by mouth every morning. For control of high blood pressure 02/25/12   Darrol Jump, MD  DULoxetine (CYMBALTA) 60 MG capsule Take 1 capsule (60 mg total) by mouth daily. For pain and mood management 02/25/12 02/24/13  Darrol Jump, MD  metFORMIN (GLUCOPHAGE) 500 MG tablet Take 2 tablets (1,000 mg total) by mouth 2 (two) times daily with a meal. For control of blood sugar 02/25/12   Darrol Jump, MD  QUEtiapine (SEROQUEL) 400 MG tablet  Take 1 tablet (400 mg total) by mouth at bedtime. For insomnia. 02/25/12 03/26/12  Darrol Jump, MD  Vitamin D, Ergocalciferol, (DRISDOL) 50000 UNITS CAPS Take 1 capsule (50,000 Units total) by mouth every 7 (seven) days. Takes on Tuesday mornings. For Vitamin D replacement and mood control. 02/25/12   Darrol Jump, MD    Family History No family history on file.  Social History Social History  Substance Use Topics  . Smoking status: Current Every Day Smoker    Packs/day: 0.50    Types: Cigarettes  . Smokeless tobacco: Never Used  . Alcohol use Yes     Allergies   Patient has no known allergies.   Review of Systems Review of Systems  Constitutional: Negative for chills, diaphoresis and fever.  Respiratory: Negative for shortness of breath.   Cardiovascular: Positive for leg swelling.  Skin: Positive for wound.  Neurological: Negative for numbness.  All other systems reviewed and are negative.    Physical Exam Updated Vital Signs BP 167/73 (BP Location: Left Arm)   Pulse 84   Temp 98.5 F (36.9 C) (Oral)   Resp 18   Ht 6' (1.829 m)   Wt 214 lb (97.1 kg)   SpO2 100%   BMI 29.02 kg/m   Physical Exam  Constitutional: He is oriented to person, place, and time. He appears well-developed and well-nourished.  HENT:  Head: Normocephalic and atraumatic.  Eyes: EOM  are normal. Pupils are equal, round, and reactive to light.  Neck: Normal range of motion. Neck supple. No JVD present.  Cardiovascular: Normal rate, regular rhythm and normal heart sounds.   No murmur heard. Pulmonary/Chest: Effort normal and breath sounds normal. He has no wheezes. He has no rales. He exhibits no tenderness.  Abdominal: Soft. Bowel sounds are normal. He exhibits no distension and no mass. There is no tenderness.  Genitourinary:  Genitourinary Comments: Rectal: Normal sphincter tone, moderate amount of stool which is light brown  Musculoskeletal: Normal range of motion. He exhibits edema.    2+ edema of the right lower leg. Right calf circumferential is 3 cm greater than left calf circumferential. Marked swelling of the right 1st and 2nd toes with maceration but no significant drainage.   Lymphadenopathy:    He has no cervical adenopathy.  Neurological: He is alert and oriented to person, place, and time. No cranial nerve deficit. He exhibits normal muscle tone. Coordination normal.  No evidence of peripheral neuropathy.   Skin: Skin is warm and dry. No rash noted.  Psychiatric: His behavior is normal. Judgment and thought content normal.  Flat affect.  Nursing note and vitals reviewed.    ED Treatments / Results  DIAGNOSTIC STUDIES:  Oxygen Saturation is 100% on RA, normal by my interpretation.    COORDINATION OF CARE:  1:20 AM Discussed treatment plan with pt at bedside and pt agreed to plan.  Labs (all labs ordered are listed, but only abnormal results are displayed) Labs Reviewed  COMPREHENSIVE METABOLIC PANEL - Abnormal; Notable for the following:       Result Value   Creatinine, Ser 2.07 (*)    Calcium 8.3 (*)    Albumin 2.6 (*)    AST 12 (*)    ALT 8 (*)    GFR calc non Af Amer 35 (*)    GFR calc Af Amer 40 (*)    All other components within normal limits  CBC WITH DIFFERENTIAL/PLATELET - Abnormal; Notable for the following:    WBC 11.8 (*)    RBC 2.88 (*)    Hemoglobin 6.9 (*)    HCT 21.6 (*)    MCV 75.0 (*)    MCH 24.0 (*)    Neutro Abs 8.8 (*)    All other components within normal limits  SEDIMENTATION RATE - Abnormal; Notable for the following:    Sed Rate 138 (*)    All other components within normal limits  VITAMIN B12  FOLATE  IRON AND TIBC  FERRITIN  RETICULOCYTES  POC OCCULT BLOOD, ED  PREPARE RBC (CROSSMATCH)  TYPE AND SCREEN     Radiology Dg Foot Complete Right  Result Date: 11/15/2016 CLINICAL DATA:  Swelling and drainage from the first and second toes without known trauma. EXAM: RIGHT FOOT COMPLETE - 3+ VIEW COMPARISON:   None. FINDINGS: Severe bony destruction of the proximal and distal phalanges of the first digit with sclerosis of the adjacent bone. There is bony fragmentation. There is pathologic fracture in the proximal phalanx. Bony destruction of the second middle phalanx. Probable second DIP dislocation. The bony changes likely represents osteomyelitis. No soft tissue gas.  Marked soft tissue swelling. IMPRESSION: Prominent bony destruction of the first digit proximal and distal phalanges, and second digit middle phalanx, consistent with osteomyelitis. Pathologic fracture of the first proximal phalanx. Probable dislocation at the second DIP. Metatarsal heads appear intact. Electronically Signed   By: Andreas Newport M.D.   On: 11/15/2016 02:04  Procedures Procedures (including critical care time)  Medications Ordered in ED Medications - No data to display   Initial Impression / Assessment and Plan / ED Course  I have reviewed the triage vital signs and the nursing notes.  Pertinent labs & imaging results that were available during my care of the patient were reviewed by me and considered in my medical decision making (see chart for details).  Marked swelling of the toes of the right foot worrisome for underlying infection such as osteomyelitis. Generalized swelling of the right leg worrisome for possible DVT - venous doppler has been ordered for in the morning. X-rays are obtained and are consistent with osteomyelitis. Screening labs are obtained showing marked anemia which is significant change from baseline (last hemoglobin was obtained in 2013 and was 11.3). Sedimentation rate is markedly elevated. He started on vancomycin. Stool sent for Hemoccult testing and all but is ordered for transfusion. He will need to be admitted for further evaluation. He has been started on enoxaparin for possible DVT pending venous Doppler in the morning. Case is discussed with Dr. Hal Hope of [facility] who agrees  to admit the patient.  Final Clinical Impressions(s) / ED Diagnoses   Final diagnoses:  Osteomyelitis of toe of right foot (HCC)  Microcytic anemia  Renal insufficiency    New Prescriptions New Prescriptions   No medications on file   I personally performed the services described in this documentation, which was scribed in my presence. The recorded information has been reviewed and is accurate.       Delora Fuel, MD 72/07/21 [id number]

## 2016-11-16 DIAGNOSIS — I70235 Atherosclerosis of native arteries of right leg with ulceration of other part of foot: Secondary | ICD-10-CM

## 2016-11-16 LAB — TYPE AND SCREEN
ABO/RH(D): A POS
ANTIBODY SCREEN: NEGATIVE
Unit division: 0
Unit division: 0

## 2016-11-16 LAB — BASIC METABOLIC PANEL
ANION GAP: 8 (ref 5–15)
BUN: 16 mg/dL (ref 6–20)
CALCIUM: 8 mg/dL — AB (ref 8.9–10.3)
CO2: 23 mmol/L (ref 22–32)
CREATININE: 2.08 mg/dL — AB (ref 0.61–1.24)
Chloride: 107 mmol/L (ref 101–111)
GFR, EST AFRICAN AMERICAN: 40 mL/min — AB (ref >60)
GFR, EST NON AFRICAN AMERICAN: 34 mL/min — AB (ref >60)
GLUCOSE: 87 mg/dL (ref 65–99)
Potassium: 3.7 mmol/L (ref 3.5–5.1)
Sodium: 138 mmol/L (ref 135–145)

## 2016-11-16 LAB — CBC
HCT: 23.2 % — ABNORMAL LOW (ref 39.0–52.0)
HEMOGLOBIN: 7.7 g/dL — AB (ref 13.0–17.0)
MCH: 25 pg — ABNORMAL LOW (ref 26.0–34.0)
MCHC: 33.2 g/dL (ref 30.0–36.0)
MCV: 75.3 fL — ABNORMAL LOW (ref 78.0–100.0)
PLATELETS: 323 10*3/uL (ref 150–400)
RBC: 3.08 MIL/uL — AB (ref 4.22–5.81)
RDW: 15.5 % (ref 11.5–15.5)
WBC: 9.2 10*3/uL (ref 4.0–10.5)

## 2016-11-16 LAB — GLUCOSE, CAPILLARY: GLUCOSE-CAPILLARY: 97 mg/dL (ref 65–99)

## 2016-11-16 LAB — HEMOGLOBIN A1C
HEMOGLOBIN A1C: 7.8 % — AB (ref 4.8–5.6)
MEAN PLASMA GLUCOSE: 177 mg/dL

## 2016-11-16 NOTE — Progress Notes (Signed)
Patient pulled out his IV and wishes to leave AMA. Dr. Maylene Roes is aware. AMA form was signed by patient and was placed in his chart.

## 2016-11-16 NOTE — Discharge Summary (Addendum)
Physician Discharge Summary  Jose Conway HWE:[id number] DOB: Feb 24, 1961 DOA: 11/15/2016  PCP: Default, Provider, MD  Admit date: 11/15/2016 Discharge date: 11/16/2016  Admitted From: Home Disposition:  Signed out against medical advice  Discharge Condition: Stable, but poor prognosis as patient refusing intervention and has signed out against medical advice  CODE STATUS: Full  Diet recommendation: Heart healthy  Brief/Interim Summary: From H&P: Jose Conway is a 56 y.o. male with past medical history of diabetes mellitus, hypertension, presents to ED with complaints of right leg swelling and right great toe ulceration, reports he had ulceration and wound for last couple months, but he started to notice foul-smelling odor for last 2 weeks, which prompted him to come to Ed. He denies fever, chills, shortness of breath, nausea vomiting, coffee-ground emesis, melena or bright red blood per rectum. In ED, labs were significant for a creatinine of 2, foot x-ray significant for osteomyelitis, and has hemoglobin of 6.9, his Hemoccult stool is negative and he was given pRBC.   Interim: He was evaluated by orthopedic surgery as well as vascular surgery. ABI diminished bilaterally. He was recommended for angiogram, revascularization, followed by possible amputation of great toe and second toe. He has refused all procedures and interventions. I discussed with him extensively that this will not get better on its own and will most definitely worsen, possibly leading to more proximal leg amputation or even death. He has refused all offers for intervention and has signed out against medical advice.   Discharge Diagnoses:  Active Problems:   Osteomyelitis of toe of right foot ([place])   Osteomyelitis (HCC)   Diabetes ([place])   Anemia   AKI (acute kidney injury) ([place])   HTN (hypertension)  First and second toe osteomyelitis - Patient with great toe ulcer for few month now, currently with foul-smelling odor,   x-ray with evidence of osteomyelitis. Started empirically on vancomycin and Zosyn. Evaluated by orthopedic surgery as well as vascular surgery with recommendations for angiogram and revascularization followed by toe amputation. He has refused all offers for treatment.   Microcytic anemia - Unclear what is patient's baseline, could be possibly due to anemia chronic disease. He presented with hemoglobin of 6.9 without complaint of GI bleed. S/p 2 units pRBC.  AKI - No recent labs for few years, presents with a creatinine of 2, unclear if acute or chronic. Given IVF.  - Lisinopril held at time of admission   Diabetes mellitus - Continue to hold metformin, start on insulin sliding scale, hemoglobin A1c 7.8   Hypertension - Blood pressure control, continue with Norvasc, and when necessary hydralazine  Tobacco abuse - Counseled on cessation    Discharge Instructions   Allergies as of 11/16/2016   No Known Allergies   Med Rec not completed, patient signed out against medical advice    No Known Allergies  Consultations:  Vascular surgery  Orthopedic surgery    Procedures/Studies: Dg Foot Complete Right  Result Date: 11/15/2016 CLINICAL DATA:  Swelling and drainage from the first and second toes without known trauma. EXAM: RIGHT FOOT COMPLETE - 3+ VIEW COMPARISON:  None. FINDINGS: Severe bony destruction of the proximal and distal phalanges of the first digit with sclerosis of the adjacent bone. There is bony fragmentation. There is pathologic fracture in the proximal phalanx. Bony destruction of the second middle phalanx. Probable second DIP dislocation. The bony changes likely represents osteomyelitis. No soft tissue gas.  Marked soft tissue swelling. IMPRESSION: Prominent bony destruction of the first digit proximal and distal  phalanges, and second digit middle phalanx, consistent with osteomyelitis. Pathologic fracture of the first proximal phalanx. Probable dislocation at the  second DIP. Metatarsal heads appear intact. Electronically Signed   By: Andreas Newport M.D.   On: 11/15/2016 02:04      Discharge Exam: Vitals:   11/16/16 0221 11/16/16 0620  BP: (!) 162/65 (!) 171/76  Pulse:  84  Resp:  14  Temp:  98.9 F (37.2 C)   Vitals:   11/15/16 1746 11/15/16 2227 11/16/16 0221 11/16/16 0620  BP: (!) 163/72 (!) 174/72 (!) 162/65 (!) 171/76  Pulse: 84 86  84  Resp:  16  14  Temp:  98.5 F (36.9 C)  98.9 F (37.2 C)  TempSrc:  Oral  Oral  SpO2:  96%  94%  Weight:      Height:        General: Pt is alert, awake, not in acute distress Cardiovascular: RRR, S1/S2 +, no rubs, no gallops Respiratory: CTA bilaterally, no wheezing, no rhonchi Abdominal: Soft, NT, ND, bowel sounds + Extremities: no edema, no cyanosis, right great toe and second toe ulcer with drainage     The results of significant diagnostics from this hospitalization (including imaging, microbiology, ancillary and laboratory) are listed below for reference.     Microbiology: No results found for this or any previous visit (from the past 240 hour(s)).   Labs: BNP (last 3 results) No results for input(s): BNP in the last 8760 hours. Basic Metabolic Panel:  Recent Labs Lab 11/15/16 0128 11/16/16 0415  NA 138 138  K 3.9 3.7  CL 104 107  CO2 25 23  GLUCOSE 95 87  BUN 17 16  CREATININE 2.07* 2.08*  CALCIUM 8.3* 8.0*   Liver Function Tests:  Recent Labs Lab 11/15/16 0128  AST 12*  ALT 8*  ALKPHOS 76  BILITOT 0.4  PROT 7.8  ALBUMIN 2.6*   No results for input(s): LIPASE, AMYLASE in the last 168 hours. No results for input(s): AMMONIA in the last 168 hours. CBC:  Recent Labs Lab 11/15/16 0128 11/16/16 0415  WBC 11.8* 9.2  NEUTROABS 8.8*  --   HGB 6.9* 7.7*  HCT 21.6* 23.2*  MCV 75.0* 75.3*  PLT 380 323   Cardiac Enzymes: No results for input(s): CKTOTAL, CKMB, CKMBINDEX, TROPONINI in the last 168 hours. BNP: Invalid input(s): POCBNP CBG:  Recent  Labs Lab 11/15/16 0733 11/15/16 1134 11/15/16 1701 11/15/16 2225 11/16/16 0736  GLUCAP 118* 123* 151* 108* 97   D-Dimer No results for input(s): DDIMER in the last 72 hours. Hgb A1c  Recent Labs  11/15/16 0154  HGBA1C 7.8*   Lipid Profile No results for input(s): CHOL, HDL, LDLCALC, TRIG, CHOLHDL, LDLDIRECT in the last 72 hours. Thyroid function studies No results for input(s): TSH, T4TOTAL, T3FREE, THYROIDAB in the last 72 hours.  Invalid input(s): FREET3 Anemia work up  Recent Labs  11/15/16 0309  VITAMINB12 216  FOLATE 4.7*  FERRITIN 66  TIBC 182*  IRON 15*  RETICCTPCT 2.1   Urinalysis    Component Value Date/Time   COLORURINE YELLOW 07/18/2011 1503   APPEARANCEUR CLEAR 07/18/2011 1503   LABSPEC 1.028 07/18/2011 1503   PHURINE 6.0 07/18/2011 1503   GLUCOSEU >1000 (A) 07/18/2011 1503   HGBUR LARGE (A) 07/18/2011 1503   BILIRUBINUR NEGATIVE 07/18/2011 1503   KETONESUR NEGATIVE 07/18/2011 1503   PROTEINUR >300 (A) 07/18/2011 1503   UROBILINOGEN 1.0 07/18/2011 1503   NITRITE NEGATIVE 07/18/2011 1503   LEUKOCYTESUR NEGATIVE  07/18/2011 1503   Sepsis Labs Invalid input(s): PROCALCITONIN,  WBC,  LACTICIDVEN Microbiology No results found for this or any previous visit (from the past 240 hour(s)).   Time coordinating discharge: < 30 minutes  SIGNED:  Dessa Phi, DO [facility] Pager [phone]  If 7PM-7AM, please contact night-coverage [email or web address] Password [facility] 11/16/2016, 10:58 AM

## 2016-11-16 NOTE — Consult Note (Signed)
[facility] Nurse wound consult note Reason for Consult: full thickness wounds on right foot.  Seen by both VVS and Orthopedics.Please see their notes for recommendations and patient's response.  Also noted is the emergent need for smoking cessation and glucose control. Wound type: Neuropathic, infectious Pressure Injury POA: No Measurement:RGT:  1.5cm x 1cm with depth obscured by the presence of adherent yellow slough. Slightly malodorous, scant serous exudate.  Right foot, second digit:  1cm round x 0.1cm with dried serum at periphery. Wound bed:As described above Drainage (amount, consistency, odor) As described above Periwound:intact, minor maceration in the RGT periwound Dressing procedure/placement/frequency: I will recommend daily cleansing of foot with house skin cleanser and to perform conservative, but effective dressing changes daily with xeroform gauze-selected for its antimicrobial and astringent properties. [facility] nursing team will not follow, but will remain available to this patient, the nursing and medical teams.  Please re-consult if needed. Thanks, Maudie Flakes, MSN, RN, [place], Arther Abbott  Pager# [phone]

## 2016-11-16 NOTE — Consult Note (Signed)
Hospital Consult   History of Present Illness: This is a 56 y.o. male here with osteomyeltis of right 1st and second toe. He does not have a history of significant vascular disease but is a long term smoker and diabetic. Denies rest pain or calf cramping with ambulation. Does not have chest pain. Cannot recall to me how long toe ulceration present and has not had any therapy for wounds at home. Denies constitutional symptoms. Does not take aspirin or statin.   Past Medical History:  Diagnosis Date  . Depression   . Diabetes mellitus without complication ([place])   . Hypertension     History reviewed. No pertinent surgical history.  No Known Allergies  Prior to Admission medications   Medication Sig Start Date End Date Taking? Authorizing Provider  amLODipine (NORVASC) 10 MG tablet Take 1 tablet (10 mg total) by mouth every morning. For control of high blood pressure 02/25/12  Yes Darrol Jump, MD  DULoxetine (CYMBALTA) 60 MG capsule Take 1 capsule (60 mg total) by mouth daily. For pain and mood management 02/25/12 11/15/16 Yes Darrol Jump, MD  lisinopril (PRINIVIL,ZESTRIL) 20 MG tablet Take 20 mg by mouth daily.   Yes Historical Provider, MD  metFORMIN (GLUCOPHAGE) 500 MG tablet Take 2 tablets (1,000 mg total) by mouth 2 (two) times daily with a meal. For control of blood sugar 02/25/12  Yes Darrol Jump, MD  QUEtiapine (SEROQUEL) 400 MG tablet Take 1 tablet (400 mg total) by mouth at bedtime. For insomnia. 02/25/12 11/15/16 Yes Darrol Jump, MD  Vitamin D, Ergocalciferol, (DRISDOL) 50000 UNITS CAPS Take 1 capsule (50,000 Units total) by mouth every 7 (seven) days. Takes on Tuesday mornings. For Vitamin D replacement and mood control. 02/25/12  Yes Darrol Jump, MD    Social History   Social History  . Marital status: Legally Separated    Spouse name: N/A  . Number of children: N/A  . Years of education: N/A   Occupational History  . Not on file.   Social History Main Topics  .  Smoking status: Current Every Day Smoker    Packs/day: 0.50    Types: Cigarettes  . Smokeless tobacco: Never Used  . Alcohol use Yes  . Drug use: No  . Sexual activity: Not on file   Other Topics Concern  . Not on file   Social History Narrative  . No narrative on file     No family history on file.  ROS: [x]  Positive   [ ]  Negative   [ ]  All sytems reviewed and are negative  Cardiovascular: []  chest pain/pressure []  palpitations []  SOB lying flat []  DOE []  pain in legs while walking []  pain in legs at rest []  pain in legs at night []  non-healing ulcers []  hx of DVT []  swelling in legs  Pulmonary: []  productive cough []  asthma/wheezing []  home O2  Neurologic: []  weakness in []  arms []  legs []  numbness in []  arms []  legs []  hx of CVA []  mini stroke [] difficulty speaking or slurred speech []  temporary loss of vision in one eye []  dizziness  Hematologic: []  hx of cancer []  bleeding problems []  problems with blood clotting easily  Endocrine:   [x]  diabetes []  thyroid disease  GI []  vomiting blood []  blood in stool  GU: [x]  CKD/renal failure []  HD--[]  M/W/F or []  T/T/S []  burning with urination []  blood in urine  Psychiatric: []  anxiety []  depression  Musculoskeletal: []  arthritis []  joint pain  Integumentary: []  rashes [x]  ulcers  Constitutional: []  fever []  chills   Physical Examination  Vitals:   11/16/16 0221 11/16/16 0620  BP: (!) 162/65 (!) 171/76  Pulse:  84  Resp:  14  Temp:  98.9 F (37.2 C)   Body mass index is 29.02 kg/m.  General:  WDWN in NAD Gait: Not observed HENT: WNL, normocephalic Pulmonary: normal non-labored breathing Cardiac: palpable femoral and popliteal pulses bilaterally Abdomen: soft, NT/ND, no masses Extremities: right foot with dressing in place Musculoskeletal: no muscle wasting or atrophy  Neurologic: A&O X 3; Appropriate Affect ; SENSATION: normal; MOTOR FUNCTION:  moving all extremities  equally. Speech is fluent/normal   CBC    Component Value Date/Time   WBC 9.2 11/16/2016 0415   RBC 3.08 (L) 11/16/2016 0415   HGB 7.7 (L) 11/16/2016 0415   HCT 23.2 (L) 11/16/2016 0415   PLT 323 11/16/2016 0415   MCV 75.3 (L) 11/16/2016 0415   MCH 25.0 (L) 11/16/2016 0415   MCHC 33.2 11/16/2016 0415   RDW 15.5 11/16/2016 0415   LYMPHSABS 1.9 11/15/2016 0128   MONOABS 0.9 11/15/2016 0128   EOSABS 0.2 11/15/2016 0128   BASOSABS 0.0 11/15/2016 0128    BMET    Component Value Date/Time   NA 138 11/16/2016 0415   K 3.7 11/16/2016 0415   CL 107 11/16/2016 0415   CO2 23 11/16/2016 0415   GLUCOSE 87 11/16/2016 0415   BUN 16 11/16/2016 0415   CREATININE 2.08 (H) 11/16/2016 0415   CALCIUM 8.0 (L) 11/16/2016 0415   GFRNONAA 34 (L) 11/16/2016 0415   GFRAA 40 (L) 11/16/2016 0415    COAGS: No results found for: INR, PROTIME   Non-Invasive Vascular Imaging:   +-----------------+---------+--------------+--------+ !Location         !Pressure !Brachial index!Waveform! +-----------------+---------+--------------+--------+ !Right post tibial!131 mm Hg!0.69          !Biphasic! +-----------------+---------+--------------+--------+ !Right dorsal ped !139 mm Hg!0.73          !Biphasic! +-----------------+---------+--------------+--------+ !Left post tibial !153 mm Hg!0.81          !Biphasic! +-----------------+---------+--------------+--------+ !Left dorsal ped  !158 mm Hg!0.83          !Biphasic! +-----------------+---------+--------------+--------+ !Left 1st toe     !94 mm Hg !0.49          !--------! +-----------------+---------+--------------+--------+   ASSESSMENT/PLAN: This is a 56 y.o. male with right great and second toe osteo and depressed ABI bilaterally. I have offered him transfer to [place] for angiogram with possible intervention on Monday and he has refused. Does have new evidence of ckd but could still undergo angiogram and I would not recommend any toe amputation  prior to revascularization. He will also be at risk for more proximal leg amputation and I have voiced this concern to him but I'm unsure of his comprehension. Needs smoking cessation urgently and aspirin and statin for medical therapy. Available for angiogram on Monday if he changes his mind.   Lorilynn Lehr C. Donzetta Matters, MD [facility] Office: [phone] Pager: [phone]

## 2016-11-20 LAB — CULTURE, BLOOD (ROUTINE X 2)
CULTURE: NO GROWTH
Culture: NO GROWTH

## 2017-11-19 ENCOUNTER — Encounter (HOSPITAL_COMMUNITY): Payer: Self-pay | Admitting: Emergency Medicine

## 2017-11-19 ENCOUNTER — Emergency Department (HOSPITAL_COMMUNITY)
Admission: EM | Admit: 2017-11-19 | Discharge: 2017-11-19 | Disposition: A | Discharge status: Home / Self Care | Payer: Medicare Other | Attending: Emergency Medicine | Admitting: Emergency Medicine

## 2017-11-19 ENCOUNTER — Emergency Department (HOSPITAL_COMMUNITY): Payer: Medicare Other

## 2017-11-19 DIAGNOSIS — Z79899 Other long term (current) drug therapy: Secondary | ICD-10-CM | POA: Diagnosis not present

## 2017-11-19 DIAGNOSIS — E119 Type 2 diabetes mellitus without complications: Secondary | ICD-10-CM | POA: Insufficient documentation

## 2017-11-19 DIAGNOSIS — J181 Lobar pneumonia, unspecified organism: Secondary | ICD-10-CM

## 2017-11-19 DIAGNOSIS — Z7984 Long term (current) use of oral hypoglycemic drugs: Secondary | ICD-10-CM | POA: Insufficient documentation

## 2017-11-19 DIAGNOSIS — J189 Pneumonia, unspecified organism: Secondary | ICD-10-CM | POA: Insufficient documentation

## 2017-11-19 DIAGNOSIS — F17210 Nicotine dependence, cigarettes, uncomplicated: Secondary | ICD-10-CM | POA: Diagnosis not present

## 2017-11-19 DIAGNOSIS — I10 Essential (primary) hypertension: Secondary | ICD-10-CM | POA: Insufficient documentation

## 2017-11-19 DIAGNOSIS — R05 Cough: Secondary | ICD-10-CM | POA: Diagnosis present

## 2017-11-19 MED ORDER — AMOXICILLIN 500 MG PO CAPS: 1000.0000 mg | ORAL_CAPSULE | Freq: Two times a day (BID) | ORAL | 0 refills | Status: DC

## 2017-11-19 MED ORDER — ACETAMINOPHEN 500 MG PO TABS
1000.0000 mg | ORAL_TABLET | Freq: Once | ORAL | Status: AC
Start: 2017-11-19 — End: 2017-11-19
  Administered 2017-11-19: 1000 mg via ORAL
  Filled 2017-11-19: qty 2

## 2017-11-19 MED ORDER — AZITHROMYCIN 250 MG PO TABS: 250.0000 mg | ORAL_TABLET | Freq: Every day | ORAL | 0 refills | Status: DC

## 2017-11-19 NOTE — ED Provider Notes (Signed)
[facility] DEPT Provider Note   CSN: [id number] Arrival date & time: 11/19/17  1038     History   Chief Complaint Chief Complaint  Patient presents with  . Cough    HPI Daymion Nazaire is a 57 y.o. male.  The history is provided by the patient and medical records. No language interpreter was used.   Traycen Goyer is a 57 y.o. male  with a PMH of DM, HTN who presents to the Emergency Department complaining of dry cough.  Patient reports he is a smoker and has had a dry cough for about a month, but yesterday, coughing worsened and he felt like he was having trouble breathing.  He has not tried any medications prior to arrival for symptoms.  He is a daily smoker, but denies having history of lung disease or using inhalers.  Denies fevers or chills, but does have temperature of 100.5 in triage.  No chest pain, abdominal pain, nausea, vomiting.  Denies sick contacts.  Denies alleviating or aggravating factors.  Past Medical History:  Diagnosis Date  . Depression   . Diabetes mellitus without complication ([place])   . Hypertension     Patient Active Problem List   Diagnosis Date Noted  . Osteomyelitis of toe of right foot ([place]) 11/15/2016  . Osteomyelitis ([place]) 11/15/2016  . Diabetes ([place]) 11/15/2016  . Anemia 11/15/2016  . AKI (acute kidney injury) ([place]) 11/15/2016  . HTN (hypertension) 11/15/2016  . Cocaine dependence 02/20/2012  . Schizoaffective disorder, depressive type ([place]) 02/20/2012    Class: Acute    History reviewed. No pertinent surgical history.     Home Medications    Prior to Admission medications   Medication Sig Start Date End Date Taking? Authorizing Provider  acetaminophen (TYLENOL) 500 MG tablet Take 1,000 mg by mouth 3 (three) times daily as needed for moderate pain.   Yes [provider]  amLODipine (NORVASC) 10 MG tablet Take 1 tablet (10 mg total) by mouth every morning. For control of high blood  pressure Patient not taking: Reported on 11/19/2017 02/25/12   Darrol Jump, MD  amoxicillin (AMOXIL) 500 MG capsule Take 2 capsules (1,000 mg total) by mouth 2 (two) times daily. 11/19/17   Abdishakur Gottschall, Ozella Almond, PA-C  azithromycin (ZITHROMAX) 250 MG tablet Take 1 tablet (250 mg total) by mouth daily. Take first 2 tablets together, then 1 every day until finished. 11/19/17   Jacquline Terrill, Ozella Almond, PA-C  DULoxetine (CYMBALTA) 60 MG capsule Take 1 capsule (60 mg total) by mouth daily. For pain and mood management 02/25/12 11/15/16  Darrol Jump, MD  metFORMIN (GLUCOPHAGE) 500 MG tablet Take 2 tablets (1,000 mg total) by mouth 2 (two) times daily with a meal. For control of blood sugar Patient not taking: Reported on 11/19/2017 02/25/12   Darrol Jump, MD  Vitamin D, Ergocalciferol, (DRISDOL) 50000 UNITS CAPS Take 1 capsule (50,000 Units total) by mouth every 7 (seven) days. Takes on Tuesday mornings. For Vitamin D replacement and mood control. Patient not taking: Reported on 11/19/2017 02/25/12   Darrol Jump, MD    Family History History reviewed. No pertinent family history.  Social History Social History   Tobacco Use  . Smoking status: Current Every Day Smoker    Packs/day: 0.50    Types: Cigarettes  . Smokeless tobacco: Never Used  Substance Use Topics  . Alcohol use: Yes  . Drug use: No     Allergies   Patient has no known  allergies.   Review of Systems Review of Systems  Constitutional: Negative for chills and fever.  HENT: Positive for congestion. Negative for sore throat.   Respiratory: Positive for cough and shortness of breath. Negative for wheezing.   Cardiovascular: Negative for chest pain, palpitations and leg swelling.  Gastrointestinal: Negative for anal bleeding, diarrhea, nausea and vomiting.  Musculoskeletal: Negative for back pain.  Skin: Negative for rash.     Physical Exam Updated Vital Signs BP (!) 145/64 (BP Location: Right Arm)   Pulse 82   Temp (!)  100.5 F (38.1 C) (Oral)   Ht 6' (1.829 m)   Wt 102.1 kg (225 lb)   SpO2 94%   BMI 30.52 kg/m   Physical Exam  Constitutional: He is oriented to person, place, and time. He appears well-developed and well-nourished. No distress.  Nontoxic-appearing.  HENT:  Head: Normocephalic and atraumatic.  Mouth/Throat: Oropharynx is clear and moist.  Neck: Neck supple.  Cardiovascular: Normal rate, regular rhythm and normal heart sounds.  No murmur heard. Pulmonary/Chest: Effort normal and breath sounds normal. No respiratory distress.  Crackles to the left lower lung field.  No wheezing.  Speaking in full sentences without any difficulty.  Abdominal: Soft. He exhibits no distension. There is no tenderness.  Neurological: He is alert and oriented to person, place, and time.  Skin: Skin is warm and dry.  Nursing note and vitals reviewed.    ED Treatments / Results  Labs (all labs ordered are listed, but only abnormal results are displayed) Labs Reviewed - No data to display  EKG  EKG Interpretation None       Radiology Dg Chest 2 View  Result Date: 11/19/2017 CLINICAL DATA:  Cough, congestion for 2 days EXAM: CHEST  2 VIEW COMPARISON:  12/08/2009 FINDINGS: Patchy airspace disease at the left lung base concerning for early pneumonia. Right lung clear. Heart is mildly enlarged. No effusions or acute bony abnormality. IMPRESSION: Patchy left lower lobe opacity concerning for early pneumonia. Electronically Signed   By: Rolm Baptise M.D.   On: 11/19/2017 11:32    Procedures Procedures (including critical care time)  Medications Ordered in ED Medications  acetaminophen (TYLENOL) tablet 1,000 mg (1,000 mg Oral Given 11/19/17 1332)     Initial Impression / Assessment and Plan / ED Course  I have reviewed the triage vital signs and the nursing notes.  Pertinent labs & imaging results that were available during my care of the patient were reviewed by me and considered in my medical  decision making (see chart for details).    Nesta Kimple is a 57 y.o. male who presents to ED for productive cough. Temp of 100.5. 94% O2 on RA. Lung exam with crackles to left base. CXR shows LLL opacity c/w exam findings. Patient ambulatory in ED and maintaining O2 saturation. Non-toxic appearing. Will treat with azithro and amoxil. Discussed tylenol / ibuprofen as needed for fevers.  PCP follow up encouraged. Reasons to return to ER including worsening breathing discussed at length. All questions answered.    Final Clinical Impressions(s) / ED Diagnoses   Final diagnoses:  Community acquired pneumonia of left lower lobe of lung [facility])    ED Discharge Orders        Ordered    azithromycin (ZITHROMAX) 250 MG tablet  Daily     11/19/17 1349    amoxicillin (AMOXIL) 500 MG capsule  2 times daily     11/19/17 1349       Luan Urbani, [place] Cerise  Pilcher, PA-C 11/19/17 1351    Dorie Rank, MD 11/20/17 [phone]

## 2017-11-19 NOTE — Discharge Instructions (Signed)
Please take all of your antibiotics until finished! Follow up with your doctor in regards to your hospital visit. Return to the emergency department if symptoms worsen, become progressive, or become more concerning.  Pneumonia, Adult Pneumonia is an infection of the lungs.   CAUSES Pneumonia may be caused by bacteria or a virus. Usually, these infections are caused by breathing infectious particles into the lungs (respiratory tract).  SYMPTOMS  Cough.  Fever.  Chest pain.  Increased rate of breathing.  Wheezing.  Mucus production.   DIAGNOSIS  If you have the common symptoms of pneumonia, your caregiver will typically confirm the diagnosis with a chest X-ray. The X-ray will show an abnormality in the lung (pulmonary infiltrate) if you have pneumonia. Other tests of your blood, urine, or sputum may be done to find the specific cause of your pneumonia. Your caregiver may also do tests (blood gases or pulse oximetry) to see how well your lungs are working.  TREATMENT  Some forms of pneumonia may be spread to other people when you cough or sneeze. You may be asked to wear a mask before and during your exam. Pneumonia that is caused by bacteria is treated with antibiotic medicine. Pneumonia that is caused by the influenza virus may be treated with an antiviral medicine. Most other viral infections must run their course. These infections will not respond to antibiotics.  HOME CARE INSTRUCTIONS  Cough suppressants may be used if you are losing too much rest. However, coughing protects you by clearing your lungs. You should avoid using cough suppressants if you can.  Your healthcare provider may have prescribed medicine if he or she thinks your pneumonia is caused by a bacteria or influenza. Finish your medicine even if you start to feel better.  Do not smoke. Smoking is a common cause of bronchitis and can contribute to pneumonia. If you are a smoker and continue to smoke, your cough may last  several weeks after your pneumonia has cleared.  A cold steam vaporizer or humidifier in your room or home may help loosen mucus.  Coughing is often worse at night. Sleeping in a semi-upright position in a recliner or using a couple pillows under your head will help with this.  Get rest as you feel it is needed. Your body will usually let you know when you need to rest.   SEEK IMMEDIATE MEDICAL CARE IF:  Your illness becomes worse. This is especially true if you are elderly or weakened from any other disease.  You cannot control your cough with suppressants and are losing sleep.  You begin coughing up blood.  You develop pain which is getting worse or is uncontrolled with medicines.  You have a fever.  Any of the symptoms which initially brought you in for treatment are getting worse rather than better.  You develop shortness of breath or chest pain.   MAKE SURE YOU:  Understand these instructions.  Will watch your condition.  Will get help right away if you are not doing well or get worse.

## 2017-11-19 NOTE — ED Triage Notes (Signed)
Pt with cough x approximately 1 month. Pt states dry and non-productive. Pt reports that he has not had fevers at home.

## 2017-11-19 NOTE — ED Notes (Signed)
Pt maintained sats at 97-98% while ambulating.

## 2017-11-26 ENCOUNTER — Emergency Department (HOSPITAL_COMMUNITY)
Admission: EM | Admit: 2017-11-26 | Discharge: 2017-11-27 | Disposition: A | Discharge status: Home / Self Care | Payer: Medicare Other | Attending: Emergency Medicine | Admitting: Emergency Medicine

## 2017-11-26 ENCOUNTER — Encounter (HOSPITAL_COMMUNITY): Payer: Self-pay | Admitting: Emergency Medicine

## 2017-11-26 ENCOUNTER — Emergency Department (HOSPITAL_COMMUNITY): Payer: Medicare Other

## 2017-11-26 DIAGNOSIS — F17210 Nicotine dependence, cigarettes, uncomplicated: Secondary | ICD-10-CM | POA: Diagnosis not present

## 2017-11-26 DIAGNOSIS — J181 Lobar pneumonia, unspecified organism: Secondary | ICD-10-CM | POA: Diagnosis not present

## 2017-11-26 DIAGNOSIS — I10 Essential (primary) hypertension: Secondary | ICD-10-CM | POA: Insufficient documentation

## 2017-11-26 DIAGNOSIS — R0789 Other chest pain: Secondary | ICD-10-CM | POA: Diagnosis present

## 2017-11-26 DIAGNOSIS — E119 Type 2 diabetes mellitus without complications: Secondary | ICD-10-CM | POA: Diagnosis not present

## 2017-11-26 DIAGNOSIS — J189 Pneumonia, unspecified organism: Secondary | ICD-10-CM

## 2017-11-26 LAB — CBC
HEMATOCRIT: 30.7 % — AB (ref 39.0–52.0)
Hemoglobin: 9.6 g/dL — ABNORMAL LOW (ref 13.0–17.0)
MCH: 25.5 pg — ABNORMAL LOW (ref 26.0–34.0)
MCHC: 31.3 g/dL (ref 30.0–36.0)
MCV: 81.4 fL (ref 78.0–100.0)
Platelets: 328 10*3/uL (ref 150–400)
RBC: 3.77 MIL/uL — ABNORMAL LOW (ref 4.22–5.81)
RDW: 15.4 % (ref 11.5–15.5)
WBC: 8.5 10*3/uL (ref 4.0–10.5)

## 2017-11-26 LAB — I-STAT TROPONIN, ED: TROPONIN I, POC: 0.02 ng/mL (ref 0.00–0.08)

## 2017-11-26 LAB — BASIC METABOLIC PANEL
ANION GAP: 11 (ref 5–15)
BUN: 17 mg/dL (ref 6–20)
CALCIUM: 8.6 mg/dL — AB (ref 8.9–10.3)
CO2: 22 mmol/L (ref 22–32)
Chloride: 107 mmol/L (ref 101–111)
Creatinine, Ser: 2.12 mg/dL — ABNORMAL HIGH (ref 0.61–1.24)
GFR calc Af Amer: 39 mL/min — ABNORMAL LOW (ref >60)
GFR, EST NON AFRICAN AMERICAN: 33 mL/min — AB (ref >60)
Glucose, Bld: 155 mg/dL — ABNORMAL HIGH (ref 65–99)
Potassium: 4 mmol/L (ref 3.5–5.1)
SODIUM: 140 mmol/L (ref 135–145)

## 2017-11-26 MED ORDER — ALBUTEROL SULFATE (2.5 MG/3ML) 0.083% IN NEBU
5.0000 mg | INHALATION_SOLUTION | Freq: Once | RESPIRATORY_TRACT | Status: AC
  Administered 2017-11-26: 5 mg via RESPIRATORY_TRACT
  Filled 2017-11-26: qty 6

## 2017-11-26 NOTE — ED Provider Notes (Signed)
[facility]ERGENCY DEPARTMENT Provider Note   CSN: [id number] Arrival date & time: 11/26/17  1648     History   Chief Complaint Chief Complaint  Patient presents with  . Chest Pain    HPI Jose Conway is a 57 y.o. male.  HPI Patient presents to the emergency department with chest pain while coughing.  The patient states that he was recently diagnosed with pneumonia in the left lower lobe and he states that he has been having persistent cough over that timeframe he states that he had some shortness of breath during that timeframe as well.  Patient states that last night and earlier today he started with chest discomfort that he felt was new but is worsened by cough.  Patient states that nothing seemed to make the condition better or worse.  The patient denies  headache,blurred vision, neck pain, fever,  weakness, numbness, dizziness, anorexia, edema, abdominal pain, nausea, vomiting, diarrhea, rash, back pain, dysuria, hematemesis, bloody stool, near syncope, or syncope. Past Medical History:  Diagnosis Date  . Depression   . Diabetes mellitus without complication ([place])   . Hypertension     Patient Active Problem List   Diagnosis Date Noted  . Osteomyelitis of toe of right foot ([place]) 11/15/2016  . Osteomyelitis ([place]) 11/15/2016  . Diabetes ([place]) 11/15/2016  . Anemia 11/15/2016  . AKI (acute kidney injury) ([place]) 11/15/2016  . HTN (hypertension) 11/15/2016  . Cocaine dependence 02/20/2012  . Schizoaffective disorder, depressive type ([place]) 02/20/2012    Class: Acute    History reviewed. No pertinent surgical history.     Home Medications    Prior to Admission medications   Medication Sig Start Date End Date Taking? Authorizing Provider  acetaminophen (TYLENOL) 500 MG tablet Take 1,000 mg by mouth 3 (three) times daily as needed for moderate pain.   Yes [provider]  amLODipine (NORVASC) 10 MG tablet Take 1 tablet (10 mg total) by mouth  every morning. For control of high blood pressure Patient not taking: Reported on 11/19/2017 02/25/12   Darrol Jump, MD  amoxicillin (AMOXIL) 500 MG capsule Take 2 capsules (1,000 mg total) by mouth 2 (two) times daily. Patient not taking: Reported on 11/26/2017 11/19/17   Ward, Ozella Almond, PA-C  azithromycin (ZITHROMAX) 250 MG tablet Take 1 tablet (250 mg total) by mouth daily. Take first 2 tablets together, then 1 every day until finished. Patient not taking: Reported on 11/26/2017 11/19/17   Ward, Ozella Almond, PA-C  DULoxetine (CYMBALTA) 60 MG capsule Take 1 capsule (60 mg total) by mouth daily. For pain and mood management Patient not taking: Reported on 11/26/2017 02/25/12 11/15/16  Darrol Jump, MD  metFORMIN (GLUCOPHAGE) 500 MG tablet Take 2 tablets (1,000 mg total) by mouth 2 (two) times daily with a meal. For control of blood sugar Patient not taking: Reported on 11/19/2017 02/25/12   Darrol Jump, MD  Vitamin D, Ergocalciferol, (DRISDOL) 50000 UNITS CAPS Take 1 capsule (50,000 Units total) by mouth every 7 (seven) days. Takes on Tuesday mornings. For Vitamin D replacement and mood control. Patient not taking: Reported on 11/19/2017 02/25/12   Darrol Jump, MD    Family History No family history on file.  Social History Social History   Tobacco Use  . Smoking status: Current Every Day Smoker    Packs/day: 0.50    Types: Cigarettes  . Smokeless tobacco: Never Used  Substance Use Topics  . Alcohol use: Yes  . Drug use:  No     Allergies   Patient has no known allergies.   Review of Systems Review of Systems  All other systems negative except as documented in the HPI. All pertinent positives and negatives as reviewed in the HPI. Physical Exam Updated Vital Signs BP (!) 169/70   Pulse 73   Temp 98.1 F (36.7 C) (Oral)   Resp 18   SpO2 96%   Physical Exam  Constitutional: He is oriented to person, place, and time. He appears well-developed and well-nourished. No  distress.  HENT:  Head: Normocephalic and atraumatic.  Mouth/Throat: Oropharynx is clear and moist.  Eyes: Pupils are equal, round, and reactive to light.  Neck: Normal range of motion. Neck supple.  Cardiovascular: Normal rate, regular rhythm and normal heart sounds. Exam reveals no gallop and no friction rub.  No murmur heard. Pulmonary/Chest: Effort normal. No respiratory distress. He has no wheezes. He has rhonchi in the left middle field and the left lower field.  Abdominal: Soft. Bowel sounds are normal. He exhibits no distension. There is no tenderness.  Neurological: He is alert and oriented to person, place, and time. He exhibits normal muscle tone. Coordination normal.  Skin: Skin is warm and dry. Capillary refill takes less than 2 seconds. No rash noted. No erythema.  Psychiatric: He has a normal mood and affect. His behavior is normal.  Nursing note and vitals reviewed.    ED Treatments / Results  Labs (all labs ordered are listed, but only abnormal results are displayed) Labs Reviewed  BASIC METABOLIC PANEL - Abnormal; Notable for the following components:      Result Value   Glucose, Bld 155 (*)    Creatinine, Ser 2.12 (*)    Calcium 8.6 (*)    GFR calc non Af Amer 33 (*)    GFR calc Af Amer 39 (*)    All other components within normal limits  CBC - Abnormal; Notable for the following components:   RBC 3.77 (*)    Hemoglobin 9.6 (*)    HCT 30.7 (*)    MCH 25.5 (*)    All other components within normal limits  I-STAT TROPONIN, ED    EKG  EKG Interpretation  Date/Time:  Tuesday November 26 2017 16:52:18 EST Ventricular Rate:  66 PR Interval:  172 QRS Duration: 92 QT Interval:  460 QTC Calculation: 482 R Axis:   76 Text Interpretation:  Normal sinus rhythm Prolonged QT Abnormal ECG When compared with ECG of 09/03/2008, QT has lengthened Confirmed by Delora Fuel ([id number]) on 11/26/2017 11:43:20 PM       Radiology Dg Chest 2 View  Result Date:  11/26/2017 CLINICAL DATA:  Left side chest pain EXAM: CHEST  2 VIEW COMPARISON:  11/19/2017 FINDINGS: Mild cardiomegaly. Lungs are clear. No effusions or edema. No acute bony abnormality. Previously seen patchy left lower lobe opacity has resolved. IMPRESSION: Cardiomegaly.  No active disease. Electronically Signed   By: Rolm Baptise M.D.   On: 11/26/2017 17:50    Procedures Procedures (including critical care time)  Medications Ordered in ED Medications  albuterol (PROVENTIL) (2.5 MG/3ML) 0.083% nebulizer solution 5 mg (5 mg Nebulization Given 11/26/17 2312)     Initial Impression / Assessment and Plan / ED Course  I have reviewed the triage vital signs and the nursing notes.  Pertinent labs & imaging results that were available during my care of the patient were reviewed by me and considered in my medical decision making (see chart for details).  Patient is sitting comfortably without any distress.  The patient has been not been tachycardic or hypoxic.  Patient has not been hypotensive.  I feel that the patient's pain is related to the persistent cough since he was diagnosed with pneumonia.  The pain is mainly when he is coughing.  Patient has no exertional components of the pain.  The patient has atypical symptoms for cardiac type chest pain.  Final Clinical Impressions(s) / ED Diagnoses   Final diagnoses:  None    ED Discharge Orders    None       Dalia Heading, PA-C 11/26/17 2351    Carlei Huang, Harrell Gave, PA-C 11/26/17 2355    Pattricia Boss, MD 11/30/17 [phone]

## 2017-11-26 NOTE — ED Triage Notes (Addendum)
Pt to ED c/o new onset L sided, non-radiating CP today - intermittent, also has SOB. Patient states it is worse with coughing, which he has been doing a lot of. He was seen at [facility] last week and dx with community-acquired pneumonia - has finished the antibiotic he was prescribed. Patient denies fevers/chills since. Resp e/u at rest, skin warm/dry. Dyspneic walking short distances.

## 2017-11-27 MED ORDER — IBUPROFEN 800 MG PO TABS: 800.0000 mg | ORAL_TABLET | Freq: Three times a day (TID) | ORAL | 0 refills | Status: DC | PRN

## 2017-11-27 MED ORDER — AEROCHAMBER PLUS FLO-VU LARGE MISC
1.0000 | Freq: Once | Status: AC
  Administered 2017-11-27: 1

## 2017-11-27 MED ORDER — GUAIFENESIN ER 1200 MG PO TB12: 1.0000 | ORAL_TABLET | Freq: Two times a day (BID) | ORAL | 0 refills | Status: DC

## 2017-11-27 MED ORDER — ALBUTEROL SULFATE HFA 108 (90 BASE) MCG/ACT IN AERS
2.0000 | INHALATION_SPRAY | RESPIRATORY_TRACT | Status: DC | PRN
  Administered 2017-11-27: 2 via RESPIRATORY_TRACT
  Filled 2017-11-27: qty 6.7

## 2017-11-27 MED ORDER — PROMETHAZINE-DM 6.25-15 MG/5ML PO SYRP: 5.0000 mL | ORAL_SOLUTION | Freq: Four times a day (QID) | ORAL | 0 refills | Status: DC | PRN

## 2017-11-27 NOTE — Discharge Instructions (Signed)
Return here for any worsening in your condition.  Follow-up with your primary doctor.  Increase your fluid intake and rest as much as possible.

## 2018-02-23 IMAGING — CR DG FOOT COMPLETE 3+V*R*
3 series · 3 of 3 positions shown · non-contrast
Comparison: None.

CLINICAL DATA: Swelling and drainage from the first and second toes
without known trauma.

EXAM:
RIGHT FOOT COMPLETE - 3+ VIEW

[x foot ap right]
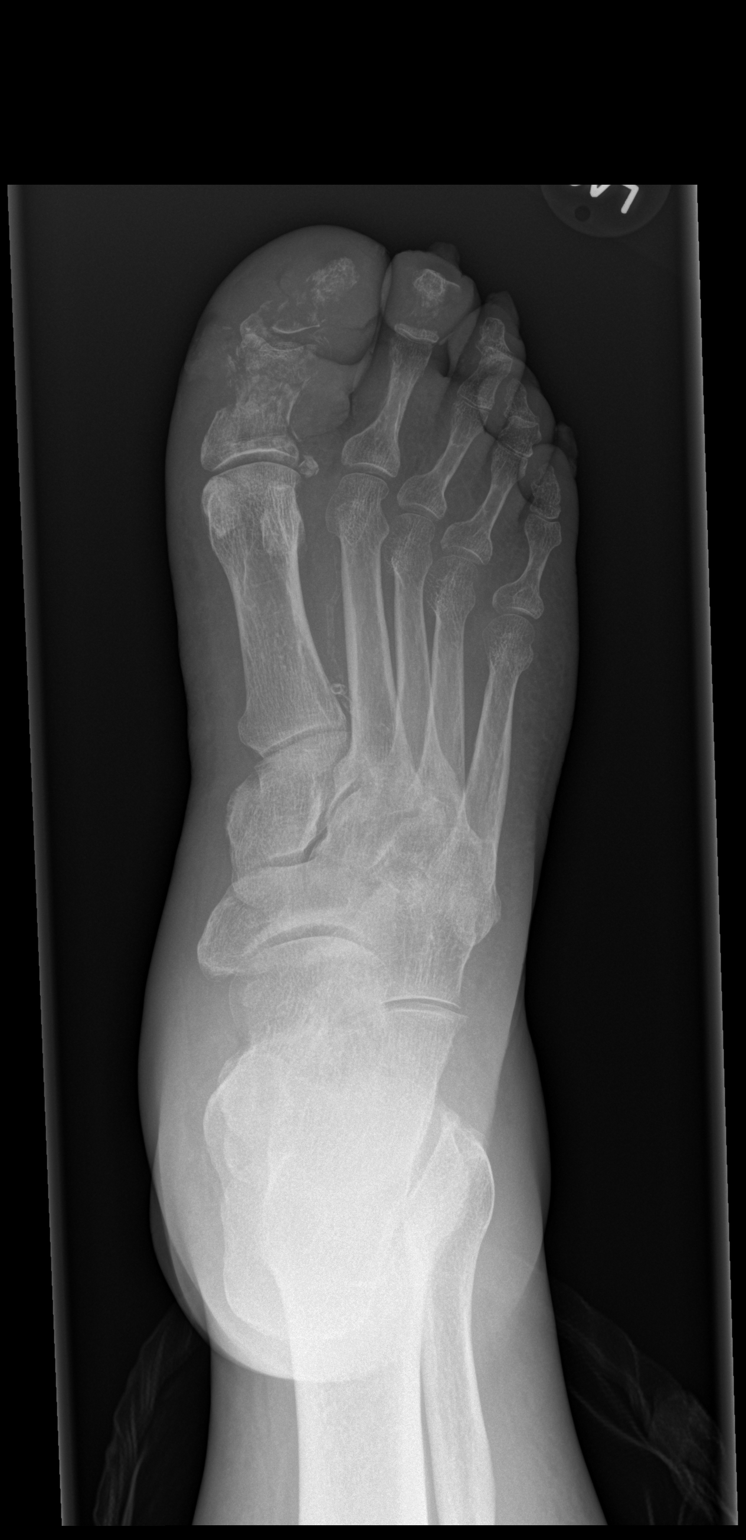

[x foot obl right]
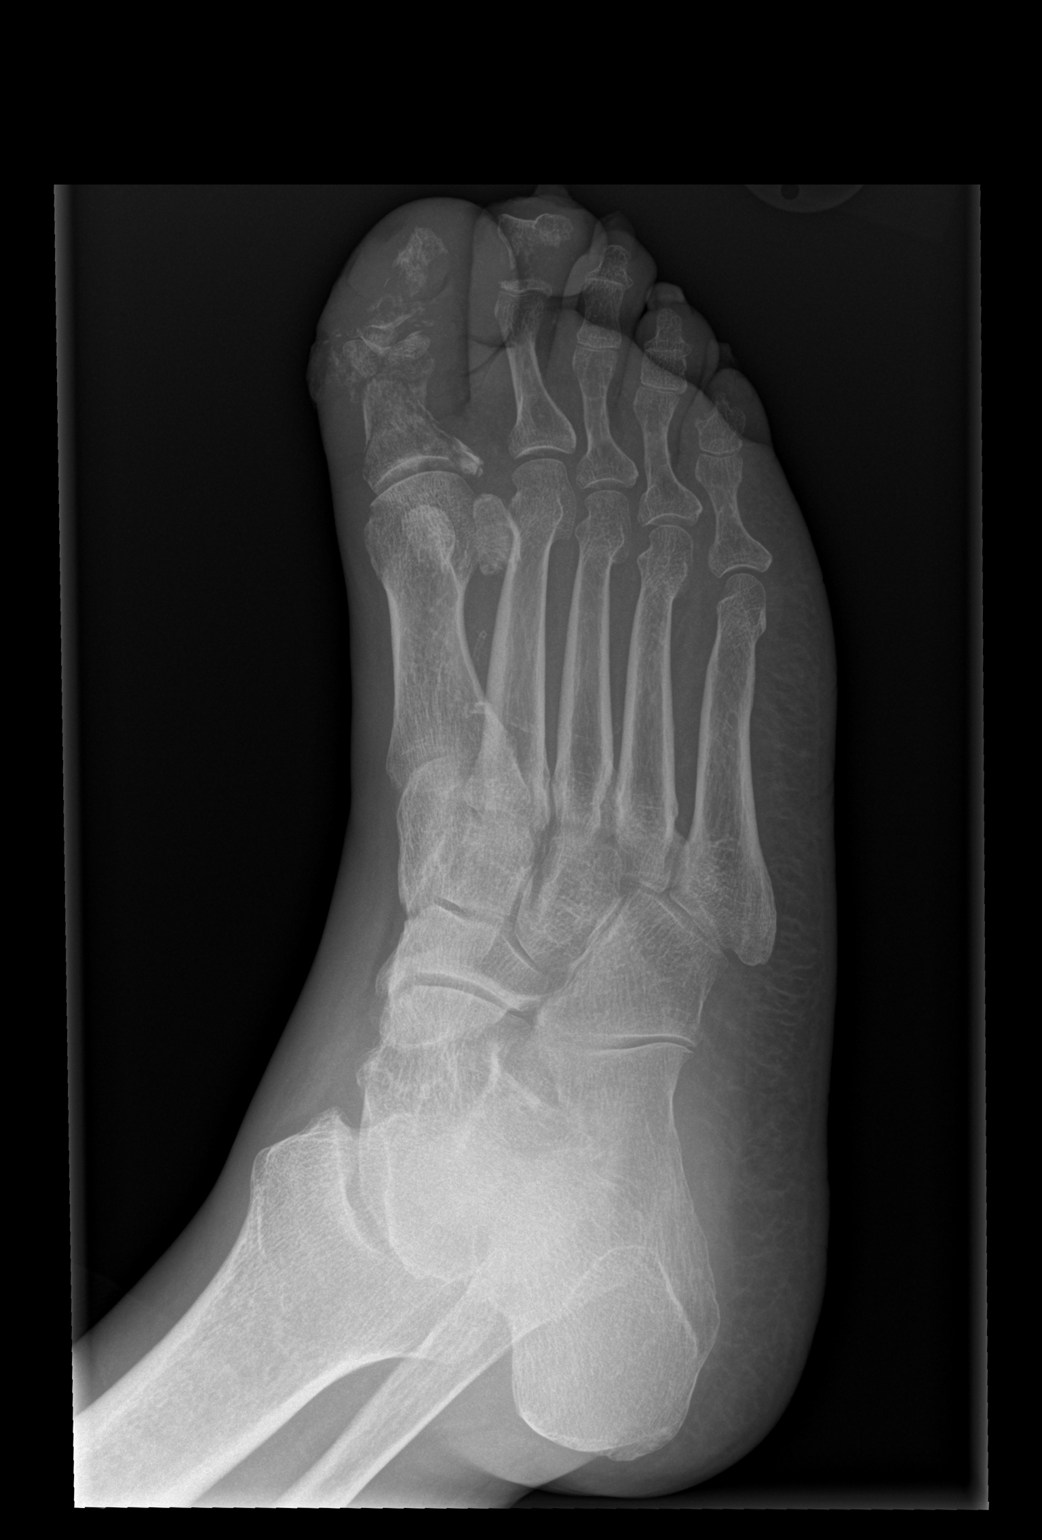

[x foot lat right]
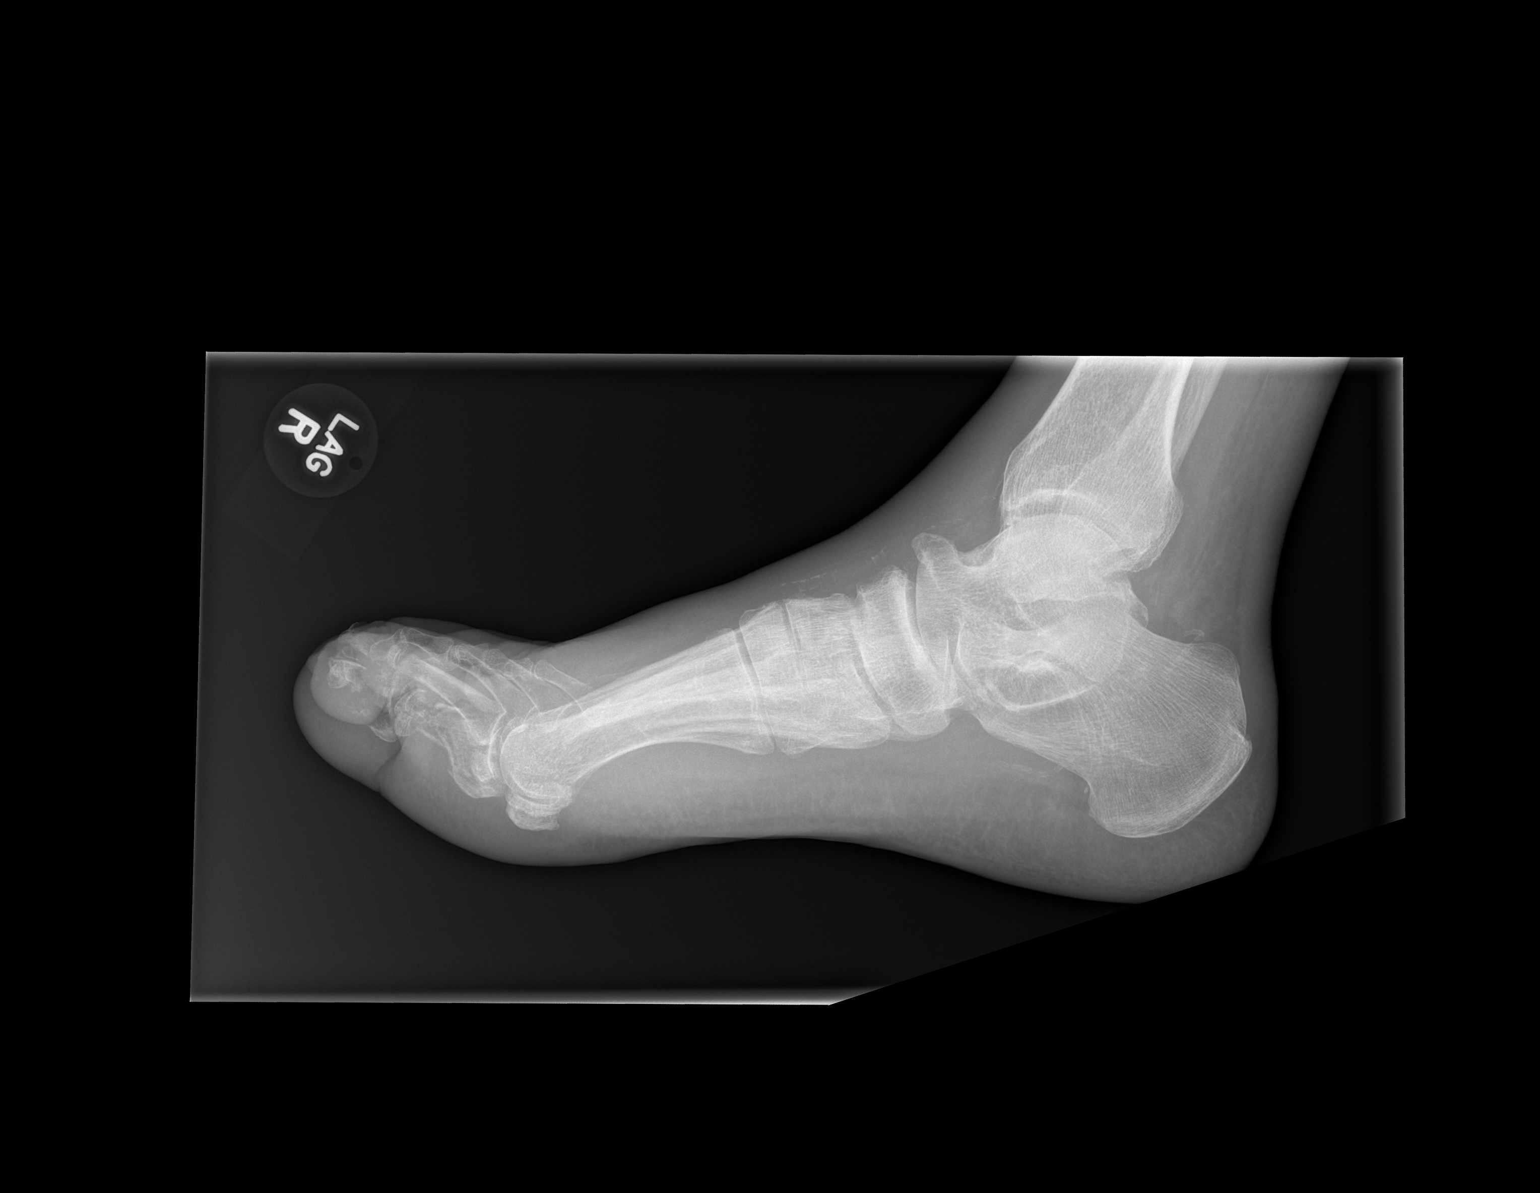

[3 of 3 positions shown; findings below may reference images not displayed]

FINDINGS: Severe bony destruction of the proximal and distal phalanges of the
first digit with sclerosis of the adjacent bone. There is bony
fragmentation. There is pathologic fracture in the proximal phalanx.

Bony destruction of the second middle phalanx. Probable second DIP
dislocation.

The bony changes likely represents osteomyelitis.

No soft tissue gas.  Marked soft tissue swelling.
IMPRESSION: Prominent bony destruction of the first digit proximal and distal
phalanges, and second digit middle phalanx, consistent with
osteomyelitis. Pathologic fracture of the first proximal phalanx.
Probable dislocation at the second DIP. Metatarsal heads appear
intact.

## 2018-06-01 HISTORY — PX: FOOT AMPUTATION THROUGH METATARSAL: SHX644

## 2018-07-10 HISTORY — PX: TOE AMPUTATION: SHX809

## 2018-10-23 ENCOUNTER — Ambulatory Visit (INDEPENDENT_AMBULATORY_CARE_PROVIDER_SITE_OTHER): Payer: Medicare Other | Admitting: Cardiology

## 2018-10-23 VITALS — BP 148/60 | HR 71 | Ht 72.0 in | Wt 211.0 lb

## 2018-10-23 DIAGNOSIS — R011 Cardiac murmur, unspecified: Secondary | ICD-10-CM | POA: Diagnosis not present

## 2018-10-23 DIAGNOSIS — Z716 Tobacco abuse counseling: Secondary | ICD-10-CM

## 2018-10-23 DIAGNOSIS — Z7189 Other specified counseling: Secondary | ICD-10-CM | POA: Diagnosis not present

## 2018-10-23 DIAGNOSIS — F17210 Nicotine dependence, cigarettes, uncomplicated: Secondary | ICD-10-CM | POA: Diagnosis not present

## 2018-10-23 DIAGNOSIS — I10 Essential (primary) hypertension: Secondary | ICD-10-CM | POA: Diagnosis not present

## 2018-10-23 NOTE — Patient Instructions (Signed)
Medication Instructions:  Your Physician recommend you continue on your current medication as directed.    If you need a refill on your cardiac medications before your next appointment, please call your pharmacy.   Lab work: None   Testing/Procedures: Your physician has requested that you have an echocardiogram. Echocardiography is a painless test that uses sound waves to create images of your heart. It provides your doctor with information about the size and shape of your heart and how well your heart's chambers and valves are working. This procedure takes approximately one hour. There are no restrictions for this procedure. [place] [address]   Follow-Up: At [facility], you and your health needs are our priority.  As part of our continuing mission to provide you with exceptional heart care, we have created designated Provider Care Teams.  These Care Teams include your primary Cardiologist (physician) and Advanced Practice Providers (APPs -  Physician Assistants and Nurse Practitioners) who all work together to provide you with the care you need, when you need it. You will need a follow up appointment in 1 years.  Please call our office 2 months in advance to schedule this appointment.  You may see Dr. Harrell Gave or one of the following Advanced Practice Providers on your designated Care Team:   Rosaria Ferries, PA-C . Jory Sims, DNP, ANP

## 2018-10-23 NOTE — Progress Notes (Signed)
Cardiology Office Note:    Date:  10/23/2018   ID:  Jose Conway, DOB 12/14/1960, MRN [id number]  PCP:  Patient, No Pcp Per  Cardiologist:  Buford Dresser, MD PhD  Referring MD: Raymondo Band, MD   Chief Complaint  Patient presents with  . Follow-up    Murmur.  CC: establish care, he doesn't know why  History of Present Illness:    Jose Conway is a 58 y.o. male with a hx of recent osteomyelitis, now at rehab SNF who is seen as a new consult at the request of Raymondo Band, MD for the evaluation and management of murmur.  Patient is here alone and has no idea why he has been referred to cardiology. No prior cardiac history. No prior cardiac workup that he knows of. Does not remember ever being told that he has a murmur. No chest pain, shortness of breath, syncope, PND, orthopnea, LE edema.   I reviewed ER visit from 11/2017 with him (nearly a year ago). He does vaguely remember having pleuritic chest pain due to coughing from pneumonia. Has not had any similar episodes since his pneumonia resolved.   Has had hypertension and diabetes "for a while." Denies any family history of heart disease.  Past Medical History:  Diagnosis Date  . CKD (chronic kidney disease) stage 3, GFR 30-59 ml/min ([facility])   . Depression   . Diabetes mellitus without complication ([place])   . Hypertension     Past Surgical History:  Procedure Laterality Date  . FOOT AMPUTATION THROUGH METATARSAL  06/01/2018  . TOE AMPUTATION Right 07/10/2018    Current Medications: Current Outpatient Medications on File Prior to Visit  Medication Sig  . acetaminophen (TYLENOL) 500 MG tablet Take 1,000 mg by mouth 3 (three) times daily as needed for moderate pain.  Marland Kitchen amLODipine (NORVASC) 10 MG tablet Take 1 tablet (10 mg total) by mouth every morning. For control of high blood pressure (Patient not taking: Reported on 11/19/2017)  . amoxicillin (AMOXIL) 500 MG capsule Take 2 capsules (1,000 mg total) by  mouth 2 (two) times daily. (Patient not taking: Reported on 11/26/2017)  . azithromycin (ZITHROMAX) 250 MG tablet Take 1 tablet (250 mg total) by mouth daily. Take first 2 tablets together, then 1 every day until finished. (Patient not taking: Reported on 11/26/2017)  . DULoxetine (CYMBALTA) 60 MG capsule Take 1 capsule (60 mg total) by mouth daily. For pain and mood management (Patient not taking: Reported on 11/26/2017)  . Guaifenesin 1200 MG TB12 Take 1 tablet (1,200 mg total) by mouth 2 (two) times daily.  Marland Kitchen ibuprofen (ADVIL,MOTRIN) 800 MG tablet Take 1 tablet (800 mg total) by mouth every 8 (eight) hours as needed.  . metFORMIN (GLUCOPHAGE) 500 MG tablet Take 2 tablets (1,000 mg total) by mouth 2 (two) times daily with a meal. For control of blood sugar (Patient not taking: Reported on 11/19/2017)  . promethazine-dextromethorphan (PROMETHAZINE-DM) 6.25-15 MG/5ML syrup Take 5 mLs by mouth 4 (four) times daily as needed for cough.  . Vitamin D, Ergocalciferol, (DRISDOL) 50000 UNITS CAPS Take 1 capsule (50,000 Units total) by mouth every 7 (seven) days. Takes on Tuesday mornings. For Vitamin D replacement and mood control. (Patient not taking: Reported on 11/19/2017)   No current facility-administered medications on file prior to visit.      Allergies:   Patient has no known allergies.   Social History   Socioeconomic History  . Marital status: Legally Separated    Spouse name: Not  on file  . Number of children: Not on file  . Years of education: Not on file  . Highest education level: Not on file  Occupational History  . Not on file  Social Needs  . Financial resource strain: Not on file  . Food insecurity:    Worry: Not on file    Inability: Not on file  . Transportation needs:    Medical: Not on file    Non-medical: Not on file  Tobacco Use  . Smoking status: Current Every Day Smoker    Packs/day: 0.50    Types: Cigarettes  . Smokeless tobacco: Never Used  Substance and Sexual  Activity  . Alcohol use: Yes  . Drug use: No  . Sexual activity: Not on file  Lifestyle  . Physical activity:    Days per week: Not on file    Minutes per session: Not on file  . Stress: Not on file  Relationships  . Social connections:    Talks on phone: Not on file    Gets together: Not on file    Attends religious service: Not on file    Active member of club or organization: Not on file    Attends meetings of clubs or organizations: Not on file    Relationship status: Not on file  Other Topics Concern  . Not on file  Social History Narrative  . Not on file     Family History: Denies any history of heart disease.  ROS:   Please see the history of present illness.  Additional pertinent ROS:  Constitutional: Negative for chills, fever, night sweats, unintentional weight loss  HENT: Negative for ear pain and hearing loss.   Eyes: Negative for loss of vision and eye pain.  Respiratory: Negative for cough, sputum, shortness of breath, wheezing.   Cardiovascular: Negative for chest pain, palpitations, PND, orthopnea, lower extremity edema and claudication.  Gastrointestinal: Negative for abdominal pain, melena, and hematochezia.  Genitourinary: Negative for dysuria and hematuria.  Musculoskeletal: Negative for falls and myalgias.  Skin: Negative for itching and rash.  Neurological: Negative for focal weakness, focal sensory changes and loss of consciousness.  Endo/Heme/Allergies: Does not bruise/bleed easily.    EKGs/Labs/Other Studies Reviewed:    The following studies were reviewed today: Notes from [facility]:   EKG:  EKG is personally reviewed.  The ekg ordered today demonstrates NSR  Recent Labs: 11/26/2017: BUN 17; Creatinine, Ser 2.12; Hemoglobin 9.6; Platelets 328; Potassium 4.0; Sodium 140  Recent Lipid Panel No results found for: CHOL, TRIG, HDL, CHOLHDL, VLDL, LDLCALC, LDLDIRECT  Physical Exam:    VS:  BP (!) 148/60 (BP Location: Right  Arm, Patient Position: Sitting, Cuff Size: Large)   Pulse 71   Ht 6' (1.829 m)   Wt 211 lb (95.7 kg)   BMI 28.62 kg/m     Wt Readings from Last 3 Encounters:  10/23/18 211 lb (95.7 kg)  11/19/17 225 lb (102.1 kg)  11/14/16 214 lb (97.1 kg)     GEN: Well nourished, well developed in no acute distress HEENT: Normal NECK: No JVD; No carotid bruits LYMPHATICS: No lymphadenopathy CARDIAC: regular rhythm, normal S1 and S2, no rubs, gallops. 2/6 HSM best heard at apex. Radial and DP pulses 2+ bilaterally. RESPIRATORY:  Clear to auscultation without rales, wheezing or rhonchi  ABDOMEN: Soft, non-tender, non-distended MUSCULOSKELETAL:  No edema; foot wounds c/d/i SKIN: Warm and dry NEUROLOGIC:  Alert and oriented x 3 PSYCHIATRIC:  Normal affect  ASSESSMENT:    1. Murmur   2. Essential hypertension   3. Tobacco abuse counseling   4. Cardiac risk counseling    PLAN:    Murmur: sounds most consistent with MR murmur. He cannot give much history regarding this. Given recent illness, will investigate to make sure that it is not due to damage of valve -echocardiogram  Hypertension: not at goal today. Given CKD and diabetes, goal <130/80.  -continue amlodipine 10 mg -best option for him would be ACEI/ARB given his diabetes. However, given his CKD, will defer initation of this to PCP or nephrology, as his labs will need to be followed  Tobacco abuse: The patient was counseled on tobacco cessation today for 5 minutes.  Counseling included reviewing the risks of smoking tobacco products, how it impacts the patient's current medical diagnoses and different strategies for quitting.  Pharmacotherapy to aid in tobacco cessation was not prescribed today. He is precontemplative  Primary prevention: Limited insight. Diet per SNF right now, limited mobility. Given his DM and CKD, recommend moderate intensity statin such as atorvastatin. He doesn't know if he has ever been on a statin before. Wants to  talk about "with the doc who gives me all my meds."  Plan for follow up: if his echo does not show any lesions requiring intervention, follow up in 1 year. Would recommend continued attempts by PCP for HTN, tobacco, and lipid management as we did not appear to make significant headway on this today.  Medication Adjustments/Labs and Tests Ordered: Current medicines are reviewed at length with the patient today.  Concerns regarding medicines are outlined above.  Orders Placed This Encounter  Procedures  . EKG 12-Lead  . ECHOCARDIOGRAM COMPLETE   No orders of the defined types were placed in this encounter.   Patient Instructions  Medication Instructions:  Your Physician recommend you continue on your current medication as directed.    If you need a refill on your cardiac medications before your next appointment, please call your pharmacy.   Lab work: None   Testing/Procedures: Your physician has requested that you have an echocardiogram. Echocardiography is a painless test that uses sound waves to create images of your heart. It provides your doctor with information about the size and shape of your heart and how well your heart's chambers and valves are working. This procedure takes approximately one hour. There are no restrictions for this procedure. [place] [address]   Follow-Up: At [facility], you and your health needs are our priority.  As part of our continuing mission to provide you with exceptional heart care, we have created designated Provider Care Teams.  These Care Teams include your primary Cardiologist (physician) and Advanced Practice Providers (APPs -  Physician Assistants and Nurse Practitioners) who all work together to provide you with the care you need, when you need it. You will need a follow up appointment in 1 years.  Please call our office 2 months in advance to schedule this appointment.  You may see Dr. Harrell Gave or one of the following  Advanced Practice Providers on your designated Care Team:   Rosaria Ferries, PA-C . Jory Sims, DNP, ANP        Signed, Buford Dresser, MD PhD 10/23/2018 8:10 AM    [place]

## 2018-10-26 ENCOUNTER — Encounter: Payer: Self-pay | Admitting: Cardiology

## 2018-10-31 ENCOUNTER — Other Ambulatory Visit: Payer: Self-pay

## 2018-10-31 ENCOUNTER — Ambulatory Visit (HOSPITAL_COMMUNITY): Payer: Medicare Other | Attending: Cardiovascular Disease

## 2018-10-31 DIAGNOSIS — R011 Cardiac murmur, unspecified: Secondary | ICD-10-CM | POA: Insufficient documentation

## 2019-03-06 IMAGING — CR DG CHEST 2V
2 series · 2 of 2 positions shown · non-contrast
Comparison: 11/19/2017

CLINICAL DATA: Left side chest pain

EXAM:
CHEST  2 VIEW

[chest pa]
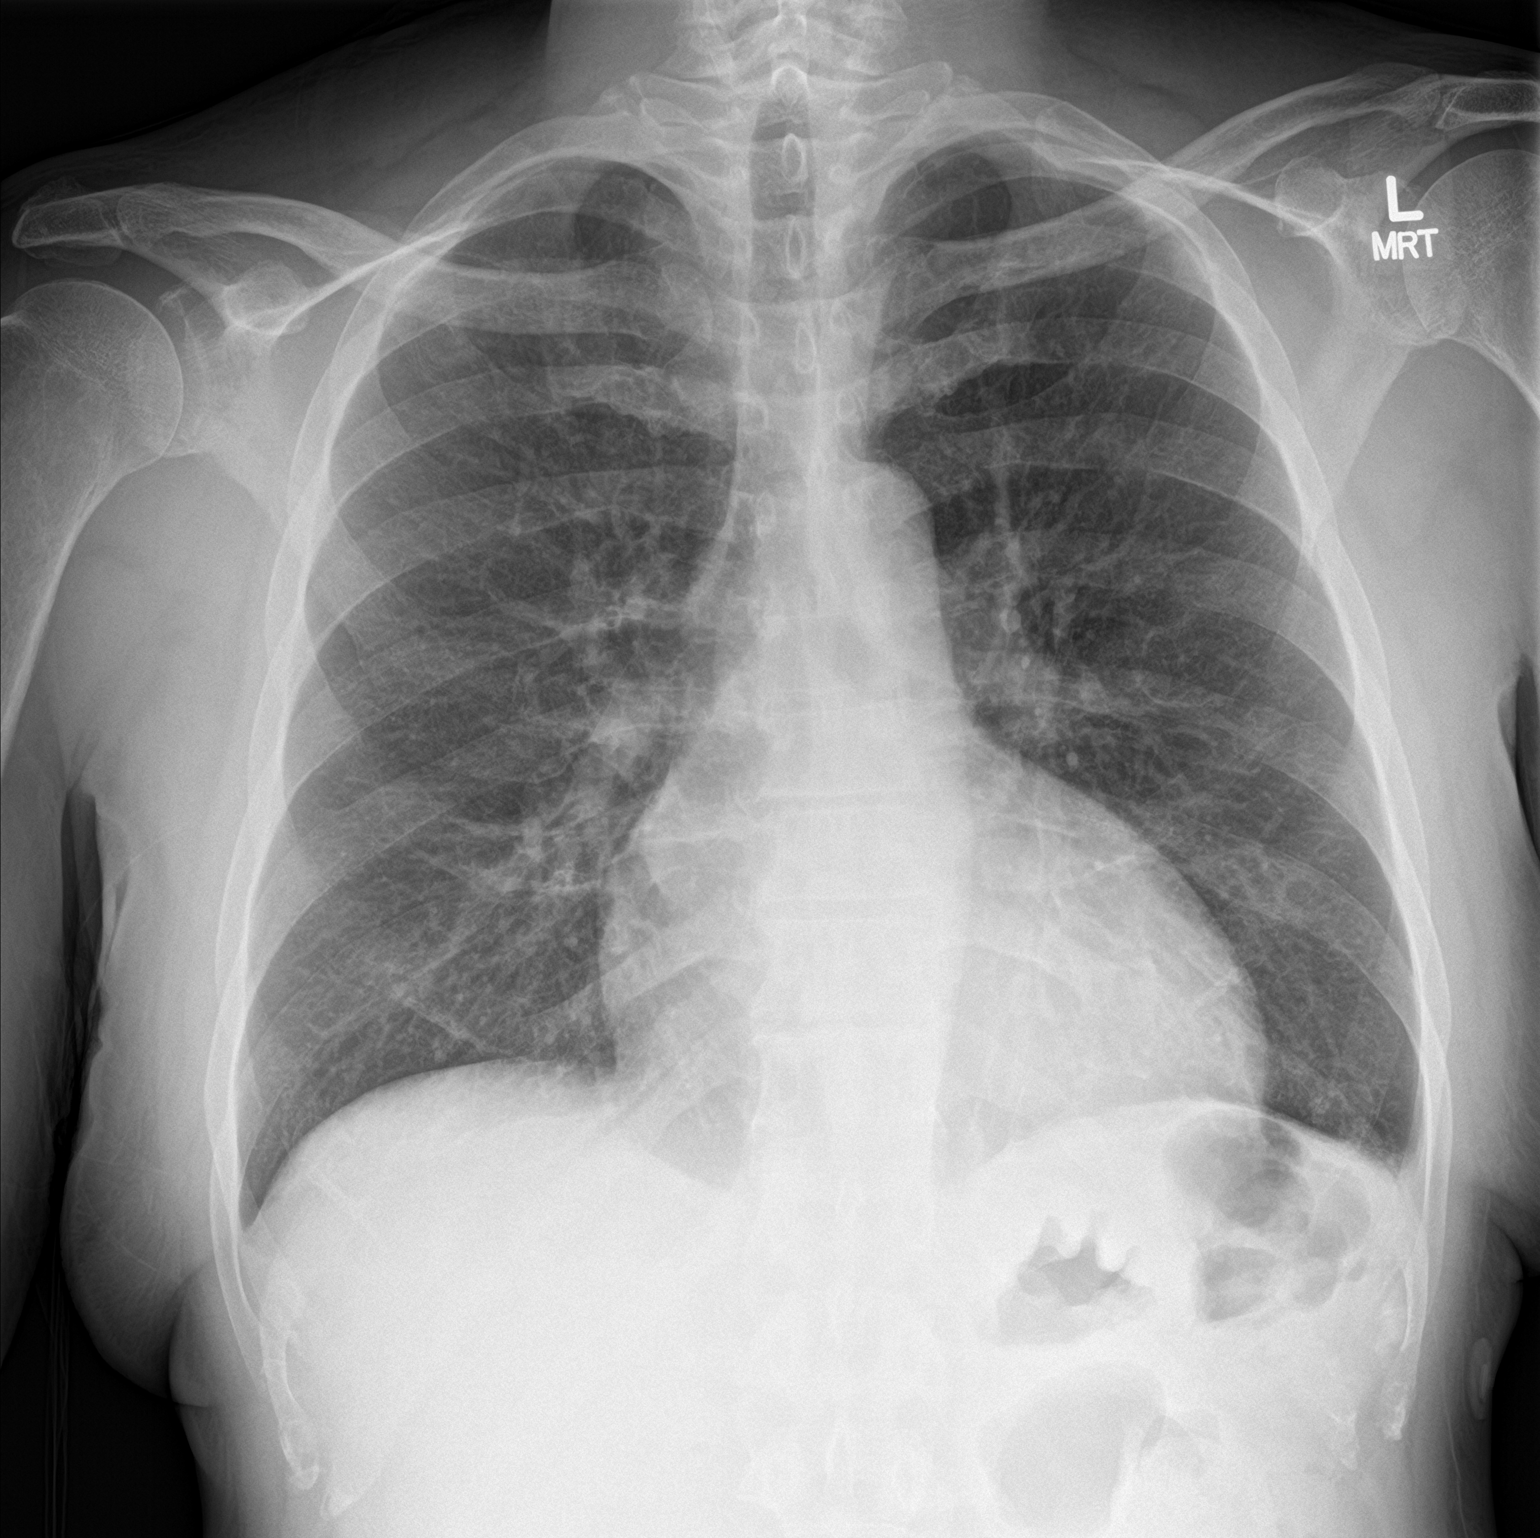

[chest lat]
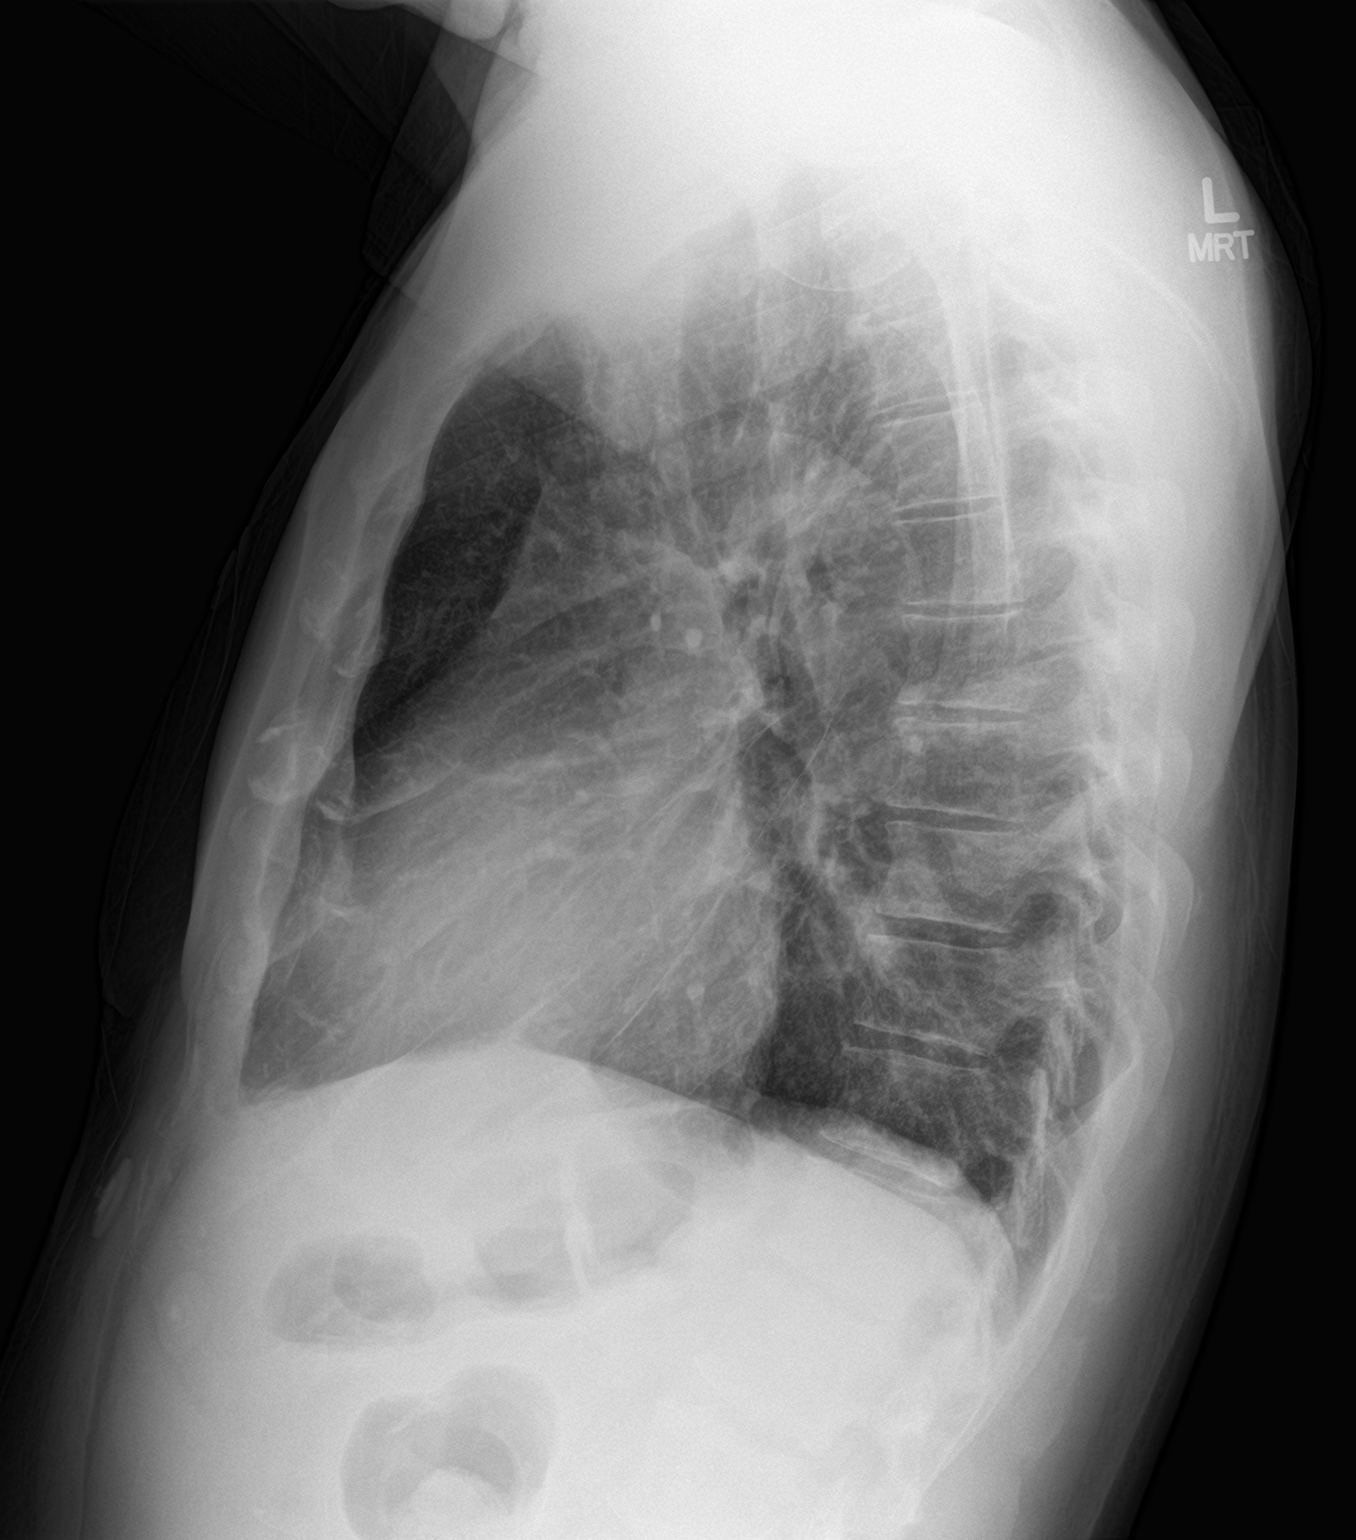

[2 of 2 positions shown; findings below may reference images not displayed]

FINDINGS: Mild cardiomegaly. Lungs are clear. No effusions or edema. No acute
bony abnormality. Previously seen patchy left lower lobe opacity has
resolved.
IMPRESSION: Cardiomegaly.  No active disease.

## 2019-11-03 ENCOUNTER — Telehealth: Payer: Self-pay | Admitting: *Deleted

## 2019-11-03 NOTE — Telephone Encounter (Signed)
Unable to leave a message,phone is fast busy.

## 2019-11-12 ENCOUNTER — Telehealth: Payer: Self-pay | Admitting: *Deleted

## 2019-11-12 NOTE — Telephone Encounter (Signed)
Unable to reach Mr.Lamountain's nurse at the nursing home.

## 2019-12-16 ENCOUNTER — Telehealth (INDEPENDENT_AMBULATORY_CARE_PROVIDER_SITE_OTHER): Payer: Medicare Other | Admitting: Cardiology

## 2019-12-16 ENCOUNTER — Encounter: Payer: Self-pay | Admitting: Cardiology

## 2019-12-16 DIAGNOSIS — R011 Cardiac murmur, unspecified: Secondary | ICD-10-CM | POA: Insufficient documentation

## 2019-12-16 DIAGNOSIS — I10 Essential (primary) hypertension: Secondary | ICD-10-CM | POA: Insufficient documentation

## 2019-12-16 DIAGNOSIS — I43 Cardiomyopathy in diseases classified elsewhere: Secondary | ICD-10-CM | POA: Diagnosis not present

## 2019-12-16 DIAGNOSIS — E119 Type 2 diabetes mellitus without complications: Secondary | ICD-10-CM

## 2019-12-16 DIAGNOSIS — R5381 Other malaise: Secondary | ICD-10-CM

## 2019-12-16 DIAGNOSIS — I119 Hypertensive heart disease without heart failure: Secondary | ICD-10-CM

## 2019-12-16 DIAGNOSIS — Z720 Tobacco use: Secondary | ICD-10-CM

## 2019-12-16 DIAGNOSIS — N183 Chronic kidney disease, stage 3 unspecified: Secondary | ICD-10-CM | POA: Insufficient documentation

## 2019-12-16 NOTE — Progress Notes (Signed)
Virtual Visit via Telephone Note   This visit type was conducted due to national recommendations for restrictions regarding the COVID-19 Pandemic (e.g. social distancing) in an effort to limit this patient's exposure and mitigate transmission in our community.  Due to his co-morbid illnesses, this patient is at least at moderate risk for complications without adequate follow up.  This format is felt to be most appropriate for this patient at this time.  The patient did not have access to video technology/had technical difficulties with video requiring transitioning to audio format only (telephone).  All issues noted in this document were discussed and addressed.  No physical exam could be performed with this format.  Please refer to the patient's chart for his  consent to telehealth for [facility].   Date:  12/16/2019   ID:  [name], DOB 07/26/1961, MRN [id number]  Patient Location: [facility] Provider Location: Home  PCP:  Patient, No Pcp Per  Cardiologist:  Buford Dresser, MD  Electrophysiologist:  None   Evaluation Performed:  Follow-Up Visit  Chief Complaint:  none  History of Present Illness:    Jose Conway is a pleasant 59 y.o. male, resident of [facility] with history of diabetes with complications, chronic renal insufficiency stage IIIb, hypertension with hypertensive cardiovascular disease, smoking, and overall debilitation.  He was evaluated by Dr. Harrell Gave in January 2020 for a heart murmur.  Echocardiogram revealed normal LV function with severe LVH.  He had mild left ear and mild MR and a PFO.  Aggressive hypertensive control was recommended.  Adjustments in his medication regimen was deferred to his primary care provider.  Patient was contacted today for a 1 year follow-up.  I spoke with the patient's nurse and the patient at the skilled nursing facility.  The staff there say that Jose Conway is doing well, he is not had any significant  issues over the past year.  I spoke with the patient who says he has not had any chest pain or shortness of breath.  He is not had trouble with his medications.  Blood pressure readings per the staff show controlled blood pressure.  The patient does not have symptoms concerning for COVID-19 infection (fever, chills, cough, or new shortness of breath).    Past Medical History:  Diagnosis Date  . CKD (chronic kidney disease) stage 3, GFR 30-59 ml/min   . Depression   . Diabetes mellitus without complication ([place])   . Hypertension    Past Surgical History:  Procedure Laterality Date  . FOOT AMPUTATION THROUGH METATARSAL  06/01/2018  . TOE AMPUTATION Right 07/10/2018     Current Meds  Medication Sig  . amLODipine (NORVASC) 10 MG tablet Take 1 tablet (10 mg total) by mouth every morning. For control of high blood pressure  . aspirin EC 81 MG tablet Take 81 mg by mouth daily.  . calcitRIOL (ROCALTROL) 0.5 MCG capsule Take 0.5 mcg by mouth daily.  . cloNIDine (CATAPRES) 0.1 MG tablet Take 0.1 mg by mouth every 8 (eight) hours as needed.  . docusate sodium (COLACE) 100 MG capsule Take 100 mg by mouth 2 (two) times daily.  . ferrous sulfate 325 (65 FE) MG EC tablet Take 325 mg by mouth daily.  Marland Kitchen glipiZIDE (GLUCOTROL XL) 5 MG 24 hr tablet Take 5 mg by mouth daily with breakfast.  . linagliptin (TRADJENTA) 5 MG TABS tablet Take 5 mg by mouth daily.  Marland Kitchen losartan (COZAAR) 25 MG tablet Take 25 mg by mouth daily.  Marland Kitchen  Omega-3 Fatty Acids (FISH OIL) 1000 MG CAPS Take 1,000 mg by mouth daily.     Allergies:   Penicillins   Social History   Tobacco Use  . Smoking status: Current Every Day Smoker    Packs/day: 0.50    Types: Cigarettes  . Smokeless tobacco: Never Used  Substance Use Topics  . Alcohol use: Yes  . Drug use: No     Family Hx: The patient's family history is not on file.  ROS:   Please see the history of present illness.    All other systems reviewed and are  negative.   Prior CV studies:   The following studies were reviewed today: Echo Jan 2020  Labs/Other Tests and Data Reviewed:    EKG:  An ECG dated 10/23/2018 was personally reviewed today and demonstrated:  NSR- HR 61  Recent Labs: No results found for requested labs within last 8760 hours.   Recent Lipid Panel No results found for: CHOL, TRIG, HDL, CHOLHDL, LDLCALC, LDLDIRECT  Wt Readings from Last 3 Encounters:  12/16/19 219 lb 6.4 oz (99.5 kg)  10/23/18 211 lb (95.7 kg)  11/19/17 225 lb (102.1 kg)     Objective:    Vital Signs:  BP 124/78   Pulse 76   Temp 98.1 F (36.7 C)   Resp 18   Ht 6' (1.829 m)   Wt 219 lb 6.4 oz (99.5 kg)   SpO2 98%   BMI 29.76 kg/m    VITAL SIGNS:  reviewed  ASSESSMENT & PLAN:    Murmur- Echo Jan 2020- mild AS, mild MR, PFO, normal LVF  HCVD- Severe LVH- B/P controlled on current medications.  CRI-3 Per PCP  DM with complications- H/O Rt foot amputation 2019  Debilitated H/O of schizophrenia in chart- pt in SNF  Plan: No current cardiac issues.  Pt followed by Dr Keenan Bachelor at [facility]- we can see PRN.   COVID-19 Education: The signs and symptoms of COVID-19 were discussed with the patient and how to seek care for testing (follow up with PCP or arrange E-visit).  The importance of social distancing was discussed today.  Time:   Today, I have spent 15 minutes with the patient and SNF staff with telehealth technology discussing the above problems.     Medication Adjustments/Labs and Tests Ordered: Current medicines are reviewed at length with the patient today.  Concerns regarding medicines are outlined above.   Tests Ordered: No orders of the defined types were placed in this encounter.   Medication Changes: No orders of the defined types were placed in this encounter.   Follow Up:  Either In Person or Virtual prn  Signed, Kerin Ransom, PA-C  12/16/2019 9:19 AM    [place]

## 2019-12-16 NOTE — Patient Instructions (Signed)
Medication Instructions:  Your physician recommends that you continue on your current medications as directed. Please refer to the Current Medication list given to you today.  *If you need a refill on your cardiac medications before your next appointment, please call your pharmacy*   Lab Work: NONE  Testing/Procedures: NONE  Follow-Up: At [facility], you and your health needs are our priority.  As part of our continuing mission to provide you with exceptional heart care, we have created designated Provider Care Teams.  These Care Teams include your primary Cardiologist (physician) and Advanced Practice Providers (APPs -  Physician Assistants and Nurse Practitioners) who all work together to provide you with the care you need, when you need it.  We recommend signing up for the patient portal called "MyChart".  Sign up information is provided on this After Visit Summary.  MyChart is used to connect with patients for Virtual Visits (Telemedicine).  Patients are able to view lab/test results, encounter notes, upcoming appointments, etc.  Non-urgent messages can be sent to your provider as well.   To learn more about what you can do with MyChart, go to [email or web address].    Your next appointment:   AS NEEDED with Dr. Harrell Gave

## 2020-02-08 ENCOUNTER — Other Ambulatory Visit: Payer: Self-pay | Admitting: Nephrology

## 2020-02-08 DIAGNOSIS — N184 Chronic kidney disease, stage 4 (severe): Secondary | ICD-10-CM

## 2020-02-24 ENCOUNTER — Other Ambulatory Visit: Payer: Self-pay

## 2020-02-24 ENCOUNTER — Ambulatory Visit
Admission: RE | Admit: 2020-02-24 | Discharge: 2020-02-24 | Disposition: A | Discharge status: Home / Self Care | Payer: Medicare Other | Source: Ambulatory Visit | Attending: Nephrology | Admitting: Nephrology

## 2020-02-24 DIAGNOSIS — N184 Chronic kidney disease, stage 4 (severe): Secondary | ICD-10-CM

## 2020-05-12 ENCOUNTER — Other Ambulatory Visit: Payer: Self-pay

## 2020-05-12 ENCOUNTER — Encounter (HOSPITAL_COMMUNITY): Payer: Self-pay | Admitting: Emergency Medicine

## 2020-05-12 ENCOUNTER — Emergency Department (HOSPITAL_COMMUNITY): Payer: Medicare Other

## 2020-05-12 ENCOUNTER — Emergency Department (HOSPITAL_COMMUNITY)
Admission: EM | Admit: 2020-05-12 | Discharge: 2020-05-12 | Disposition: A | Discharge status: Home / Self Care | Payer: Medicare Other | Attending: Emergency Medicine | Admitting: Emergency Medicine

## 2020-05-12 DIAGNOSIS — Z79899 Other long term (current) drug therapy: Secondary | ICD-10-CM | POA: Insufficient documentation

## 2020-05-12 DIAGNOSIS — E119 Type 2 diabetes mellitus without complications: Secondary | ICD-10-CM | POA: Insufficient documentation

## 2020-05-12 DIAGNOSIS — N183 Chronic kidney disease, stage 3 unspecified: Secondary | ICD-10-CM | POA: Diagnosis not present

## 2020-05-12 DIAGNOSIS — F17210 Nicotine dependence, cigarettes, uncomplicated: Secondary | ICD-10-CM | POA: Insufficient documentation

## 2020-05-12 DIAGNOSIS — R519 Headache, unspecified: Secondary | ICD-10-CM | POA: Insufficient documentation

## 2020-05-12 DIAGNOSIS — I129 Hypertensive chronic kidney disease with stage 1 through stage 4 chronic kidney disease, or unspecified chronic kidney disease: Secondary | ICD-10-CM | POA: Insufficient documentation

## 2020-05-12 LAB — CBC WITH DIFFERENTIAL/PLATELET
Abs Immature Granulocytes: 0.03 10*3/uL (ref 0.00–0.07)
Basophils Absolute: 0.1 10*3/uL (ref 0.0–0.1)
Basophils Relative: 1 %
Eosinophils Absolute: 0.2 10*3/uL (ref 0.0–0.5)
Eosinophils Relative: 2 %
HCT: 43.1 % (ref 39.0–52.0)
Hemoglobin: 13.3 g/dL (ref 13.0–17.0)
Immature Granulocytes: 0 %
Lymphocytes Relative: 15 %
Lymphs Abs: 1.6 10*3/uL (ref 0.7–4.0)
MCH: 25.3 pg — ABNORMAL LOW (ref 26.0–34.0)
MCHC: 30.9 g/dL (ref 30.0–36.0)
MCV: 82.1 fL (ref 80.0–100.0)
Monocytes Absolute: 0.7 10*3/uL (ref 0.1–1.0)
Monocytes Relative: 7 %
Neutro Abs: 7.6 10*3/uL (ref 1.7–7.7)
Neutrophils Relative %: 75 %
Platelets: 244 10*3/uL (ref 150–400)
RBC: 5.25 MIL/uL (ref 4.22–5.81)
RDW: 14 % (ref 11.5–15.5)
WBC: 10.1 10*3/uL (ref 4.0–10.5)
nRBC: 0 % (ref 0.0–0.2)

## 2020-05-12 LAB — URINALYSIS, ROUTINE W REFLEX MICROSCOPIC
Bilirubin Urine: NEGATIVE
Glucose, UA: NEGATIVE mg/dL
Hgb urine dipstick: NEGATIVE
Ketones, ur: NEGATIVE mg/dL
Leukocytes,Ua: NEGATIVE
Nitrite: NEGATIVE
Protein, ur: NEGATIVE mg/dL
Specific Gravity, Urine: 1.008 (ref 1.005–1.030)
pH: 6 (ref 5.0–8.0)

## 2020-05-12 LAB — BASIC METABOLIC PANEL
Anion gap: 11 (ref 5–15)
BUN: 29 mg/dL — ABNORMAL HIGH (ref 6–20)
CO2: 23 mmol/L (ref 22–32)
Calcium: 9.1 mg/dL (ref 8.9–10.3)
Chloride: 104 mmol/L (ref 98–111)
Creatinine, Ser: 3.43 mg/dL — ABNORMAL HIGH (ref 0.61–1.24)
GFR calc Af Amer: 22 mL/min — ABNORMAL LOW (ref >60)
GFR calc non Af Amer: 19 mL/min — ABNORMAL LOW (ref >60)
Glucose, Bld: 118 mg/dL — ABNORMAL HIGH (ref 70–99)
Potassium: 4.8 mmol/L (ref 3.5–5.1)
Sodium: 138 mmol/L (ref 135–145)

## 2020-05-12 MED ORDER — SODIUM CHLORIDE 0.9 % IV BOLUS
500.0000 mL | Freq: Once | INTRAVENOUS | Status: AC
  Administered 2020-05-12: 500 mL via INTRAVENOUS

## 2020-05-12 MED ORDER — CLONIDINE HCL 0.1 MG PO TABS
0.1000 mg | ORAL_TABLET | Freq: Once | ORAL | Status: AC
  Administered 2020-05-12: 0.1 mg via ORAL
  Filled 2020-05-12: qty 1

## 2020-05-12 MED ORDER — METHYLPREDNISOLONE SODIUM SUCC 125 MG IJ SOLR
80.0000 mg | Freq: Once | INTRAMUSCULAR | Status: AC
  Administered 2020-05-12: 80 mg via INTRAVENOUS
  Filled 2020-05-12: qty 2

## 2020-05-12 MED ORDER — METOCLOPRAMIDE HCL 5 MG/ML IJ SOLN
5.0000 mg | Freq: Once | INTRAMUSCULAR | Status: AC
  Administered 2020-05-12: 5 mg via INTRAVENOUS
  Filled 2020-05-12: qty 2

## 2020-05-12 MED ORDER — ACETAMINOPHEN 500 MG PO TABS
1000.0000 mg | ORAL_TABLET | Freq: Once | ORAL | Status: AC
  Administered 2020-05-12: 1000 mg via ORAL
  Filled 2020-05-12: qty 2

## 2020-05-12 NOTE — ED Notes (Signed)
Called [facility] 

## 2020-05-12 NOTE — ED Provider Notes (Signed)
[facility] DEPT Provider Note   CSN: [id number] Arrival date & time: 05/12/20  4132     History Chief Complaint  Patient presents with  . Headache    Jose Conway is a 59 y.o. male.  HPI      Jose Conway is a 59 y.o. male, with a history of CKD stage III, depression, DM, HTN, presenting to the ED with headache beginning at 1 AM this morning.   Pain is right temporal, throbbing/sharp, nonradiating, intermittent.   Took tylenol without improvement. Nothing makes it better or worse.   Denies fever/chills, N/V, vision loss, neuro deficits, recent trauma/falls, neck pain/stiffness, syncope, cough, chest pain, shortness of breath, difficulty urinating, or any other complaints.     Past Medical History:  Diagnosis Date  . CKD (chronic kidney disease) stage 3, GFR 30-59 ml/min   . Depression   . Diabetes mellitus without complication ([place])   . Hypertension     Patient Active Problem List   Diagnosis Date Noted  . Essential hypertension 12/16/2019  . CRI (chronic renal insufficiency), stage 3 (moderate) 12/16/2019  . Murmur, cardiac 12/16/2019  . Tobacco abuse 12/16/2019  . Debilitated patient 12/16/2019  . Osteomyelitis of toe of right foot ([place]) 11/15/2016  . Osteomyelitis ([place]) 11/15/2016  . Non-insulin dependent type 2 diabetes mellitus ([place]) 11/15/2016  . Anemia 11/15/2016  . AKI (acute kidney injury) ([place]) 11/15/2016  . Hypertensive cardiomyopathy, without heart failure ([place]) 11/15/2016  . Cocaine dependence 02/20/2012  . Schizoaffective disorder, depressive type ([place]) 02/20/2012    Class: Acute    Past Surgical History:  Procedure Laterality Date  . FOOT AMPUTATION THROUGH METATARSAL  06/01/2018  . TOE AMPUTATION Right 07/10/2018       History reviewed. No pertinent family history.  Social History   Tobacco Use  . Smoking status: Current Every Day Smoker    Packs/day: 0.50    Types: Cigarettes  . Smokeless  tobacco: Never Used  Substance Use Topics  . Alcohol use: Yes  . Drug use: No    Home Medications Prior to Admission medications   Medication Sig Start Date End Date Taking? Authorizing Provider  amLODipine (NORVASC) 10 MG tablet Take 1 tablet (10 mg total) by mouth every morning. For control of high blood pressure 02/25/12   Darrol Jump, MD  aspirin EC 81 MG tablet Take 81 mg by mouth daily.    [provider]  calcitRIOL (ROCALTROL) 0.5 MCG capsule Take 0.5 mcg by mouth daily.    [provider]  cloNIDine (CATAPRES) 0.1 MG tablet Take 0.1 mg by mouth every 8 (eight) hours as needed.    [provider]  docusate sodium (COLACE) 100 MG capsule Take 100 mg by mouth 2 (two) times daily.    [provider]  ferrous sulfate 325 (65 FE) MG EC tablet Take 325 mg by mouth daily.    [provider]  glipiZIDE (GLUCOTROL XL) 5 MG 24 hr tablet Take 5 mg by mouth daily with breakfast.    [provider]  linagliptin (TRADJENTA) 5 MG TABS tablet Take 5 mg by mouth daily.    [provider]  losartan (COZAAR) 25 MG tablet Take 25 mg by mouth daily.    [provider]  Omega-3 Fatty Acids (FISH OIL) 1000 MG CAPS Take 1,000 mg by mouth daily.    [provider]  simvastatin (ZOCOR) 10 MG tablet Take 10 mg by mouth at bedtime. 04/14/20   [provider]    Allergies    Penicillins  Review of Systems   Review of Systems  Constitutional: Negative for chills, diaphoresis, fatigue and fever.  Respiratory: Negative for cough and shortness of breath.   Cardiovascular: Negative for chest pain.  Gastrointestinal: Negative for abdominal pain, diarrhea, nausea and vomiting.  Musculoskeletal: Negative for neck pain and neck stiffness.  Neurological: Positive for headaches. Negative for dizziness, syncope, weakness, light-headedness and numbness.  All other systems reviewed and are negative.   Physical Exam Updated  Vital Signs BP (!) 171/71   Pulse 61   Temp 98 F (36.7 C) (Oral)   Resp 13   Ht 6' (1.829 m)   Wt (!) 103.4 kg   SpO2 100%   BMI 30.92 kg/m   Physical Exam Vitals and nursing note reviewed.  Constitutional:      General: He is not in acute distress.    Appearance: He is well-developed. He is not diaphoretic.  HENT:     Head: Normocephalic and atraumatic.     Mouth/Throat:     Mouth: Mucous membranes are moist.     Pharynx: Oropharynx is clear.  Eyes:     Conjunctiva/sclera: Conjunctivae normal.  Cardiovascular:     Rate and Rhythm: Normal rate and regular rhythm.     Pulses: Normal pulses.          Radial pulses are 2+ on the right side and 2+ on the left side.       Posterior tibial pulses are 2+ on the right side and 2+ on the left side.     Heart sounds: Normal heart sounds.     Comments: Tactile temperature in the extremities appropriate and equal bilaterally. Pulmonary:     Effort: Pulmonary effort is normal. No respiratory distress.     Breath sounds: Normal breath sounds.  Abdominal:     Palpations: Abdomen is soft.     Tenderness: There is no abdominal tenderness. There is no guarding.  Musculoskeletal:     Cervical back: Neck supple.     Right lower leg: No edema.     Left lower leg: No edema.  Lymphadenopathy:     Cervical: No cervical adenopathy.  Skin:    General: Skin is warm and dry.  Neurological:     Mental Status: He is alert and oriented to person, place, and time.     Comments: No noted acute cognitive deficit. Sensation grossly intact to light touch in the extremities.   Grip strengths equal bilaterally.   Strength 5/5 in all extremities.  No gait disturbance.  Coordination intact.  Cranial nerves III-XII grossly intact.  Handles oral secretions without noted difficulty.  No noted phonation or speech deficit. No facial droop.   Psychiatric:        Mood and Affect: Mood and affect normal.        Speech: Speech normal.        Behavior:  Behavior normal.     ED Results / Procedures / Treatments   Labs (all labs ordered are listed, but only abnormal results are displayed) Labs Reviewed  BASIC METABOLIC PANEL - Abnormal; Notable for the following components:      Result Value   Glucose, Bld 118 (*)    BUN 29 (*)    Creatinine, Ser 3.43 (*)    GFR calc non Af Amer 19 (*)    GFR calc Af Amer 22 (*)    All other components within normal limits  CBC WITH DIFFERENTIAL/PLATELET - Abnormal; Notable for the following components:   MCH 25.3 (*)    All other components within normal limits  URINALYSIS, ROUTINE W REFLEX MICROSCOPIC - Abnormal; Notable for the following components:   Color, Urine STRAW (*)    All other components within normal limits    EKG None  Radiology CT Head Wo Contrast  Result Date: 05/12/2020 CLINICAL DATA:  Progressive headache.  Hypertension. EXAM: CT HEAD WITHOUT CONTRAST TECHNIQUE: Contiguous axial images were obtained from the base of the skull through the vertex without intravenous contrast. COMPARISON:  April 22, 2018 FINDINGS: Brain: Mild diffuse atrophy is stable. There is no intracranial mass, hemorrhage, extra-axial fluid collection, or midline shift. There is patchy small vessel disease in the centra semiovale bilaterally. There is evidence of a prior small infarct in the right thalamus, stable. There is evidence of a prior small infarct in the posterior limb of the left internal capsule, stable. No acute appearing infarct is evident. Vascular: No hyperdense vessel. There is calcification in each carotid siphon region. Skull: The bony calvarium appears intact. Sinuses/Orbits: Visualized paranasal sinuses are clear. Prior right nasal fracture evident. Visualized orbits appear symmetric bilaterally. Other: Mastoid air cells are clear. IMPRESSION: Stable atrophy with patchy periventricular small vessel disease. 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 No evident acute infarct. Foci of arterial vascular calcification noted. Old right nasal fracture. Electronically Signed   By: Lowella Grip III M.D.   On: 05/12/2020 11:55    Procedures Procedures (including critical care time)  Medications Ordered in ED Medications  sodium chloride 0.9 % bolus 500 mL (0 mLs Intravenous Stopped 05/12/20 1219)  methylPREDNISolone sodium succinate (SOLU-MEDROL) 125 mg/2 mL injection 80 mg (80 mg Intravenous Given 05/12/20 1132)  metoCLOPramide (REGLAN) injection 5 mg (5 mg Intravenous Given 05/12/20 1133)  cloNIDine (CATAPRES) tablet 0.1 mg (0.1 mg Oral Given 05/12/20 1255)  acetaminophen (TYLENOL) tablet 1,000 mg (1,000 mg Oral Given 05/12/20 1254)    ED Course  I have reviewed the triage vital signs and the nursing notes.  Pertinent labs & imaging results that were available during my care of the patient were reviewed by me and considered in my medical decision making (see chart for details).  Clinical Course as of May 12 1425  Thu May 12, 2020  1220 Not an accurate respiratory rate.  Resp(!): 7 [SJ]  1220 Spoke with Doylene Bode, nurse at [facility] where patient lives. States his last creatinine was 4.45 on March 25.  It has been recommended that he go on dialysis, but he has been refusing to start dialysis stating he does not need it. This morning, he began to complain of a headache.  Doesn't usually complain of headaches.  They also noted his systolic blood pressure was over 200, had to convince patient to go to hospital.  She confirms patient did receive his 9 AM medications.   [SJ]  [facility] reports creatinine was 4.45 on January 07, 2020.  Creatinine(!): 3.43 [SJ]  1411 Patient states he feels much better.   [SJ]    Clinical Course User Index [SJ] Eltha Tingley, Helane Gunther, PA-C   MDM Rules/Calculators/A&P                          Patient presents complaining of headache. Patient is nontoxic appearing, afebrile, not tachycardic, not  tachypneic, not hypotensive, maintains excellent SPO2 on room air,  and is in no apparent distress.   I have reviewed the patient's chart to obtain more information.   I reviewed and interpreted the patient's labs and radiological studies.  Patient was initially given slow IV fluids.  When creatinine returned elevated beyond what I saw for him in the past, I had the nurse discontinue his IV fluids.  Patient received 100 cc of IV fluids total. I was later able to confirm that the patient's creatinine is actually better than his most recent value and that his overall elevation is chronic. His headache could be due to his blood pressure elevations, which in turn could be due to his chronically poor kidney function.  However, my suspicion for hypertensive emergency is low.  No focal neurologic deficits, no chest pain, no change in urination ability.  The patient was given instructions for home care as well as return precautions. Patient voices understanding of these instructions, accepts the plan, and is comfortable with discharge.   Findings and plan of care discussed with Antony Blackbird, MD.    Vitals:   05/12/20 1300 05/12/20 1330 05/12/20 1400 05/12/20 1430  BP: (!) 182/87 (!) 196/83 (!) 179/80 (!) 177/80  Pulse: 72 75 70 72  Resp: 13 15 (!) 11 18  Temp:      TempSrc:      SpO2: 100% 100% 98% 98%  Weight:      Height:         Final Clinical Impression(s) / ED Diagnoses Final diagnoses:  Right-sided headache    Rx / DC Orders ED Discharge Orders    None       Layla Maw 05/12/20 1524    Tegeler, Gwenyth Allegra, MD 05/12/20 1559

## 2020-05-12 NOTE — Discharge Instructions (Signed)
Headache Your lab results showed no acute abnormalities.  For future headaches please try the following regimen: Acetaminophen: May take acetaminophen (generic for Tylenol), as needed, for pain. Your daily total maximum amount of acetaminophen from all sources should be limited to 4000mg /day for persons without liver problems, or 2000mg /day for those with liver problems.  Hydration: Be sure to stay well-hydrated throughout the day.  Sleep: Please be sure to get plenty of sleep with a goal of 8 hours per night. Having a regular bed time and bedtime routine can help with this.  Screens: Reduce the amount of time you are in front of screens.  Take about a 5-10-minute break every hour or every couple hours to give your eyes rest.  Do not use screens in dark rooms.  Glasses with a blue light filter may also help reduce eye fatigue.  Stress: Take steps to reduce stress as much as possible.   Follow up: Follow-up with your primary care provider on this issue.  May also need to follow-up with the neurologist. Due to your kidney function, this may be affecting your blood pressure, which could be causing your headaches.

## 2020-05-12 NOTE — ED Notes (Signed)
Patient transported to CT 

## 2020-05-12 NOTE — ED Triage Notes (Signed)
Per EMS, pt from [facility] complains of headache since last night. Pt given 1000mg  tylenol w/o relief. BP 184/86, denies blurred vision, chest pain, SOB.   HR 70 SPo2 100 Temp 98.1 CBG 126

## 2021-01-20 ENCOUNTER — Ambulatory Visit (INDEPENDENT_AMBULATORY_CARE_PROVIDER_SITE_OTHER): Payer: Medicare Other | Admitting: Podiatry

## 2021-01-20 ENCOUNTER — Other Ambulatory Visit: Payer: Self-pay

## 2021-01-20 ENCOUNTER — Encounter: Payer: Self-pay | Admitting: Podiatry

## 2021-01-20 DIAGNOSIS — E119 Type 2 diabetes mellitus without complications: Secondary | ICD-10-CM | POA: Diagnosis not present

## 2021-01-20 DIAGNOSIS — M79674 Pain in right toe(s): Secondary | ICD-10-CM | POA: Diagnosis not present

## 2021-01-20 DIAGNOSIS — M79675 Pain in left toe(s): Secondary | ICD-10-CM

## 2021-01-20 DIAGNOSIS — B351 Tinea unguium: Secondary | ICD-10-CM | POA: Diagnosis not present

## 2021-01-20 NOTE — Progress Notes (Signed)
  Subjective:  Patient ID: Jose Conway, male    DOB: 06-May-1961,  MRN: [id number]  Chief Complaint  Patient presents with  . Nail Problem    Nail trim long thickened nails    60 y.o. male returns for the above complaint.  Patient presents with thickened elongated dystrophic toenails x10.  Patient has a history of amputation to bilateral foot with bilateral hallux and right second digit.  Patient states the last time many years ago secondary to infection.  He is a diabetic with unknown A1c.  He would like to discuss debriding the nails down as is not able to do himself.  He states it painful to walk with he denies any other acute complaints  Objective:  There were no vitals filed for this visit. Podiatric Exam: Vascular: dorsalis pedis and posterior tibial pulses are palpable bilateral. Capillary return is immediate. Temperature gradient is WNL. Skin turgor WNL  Sensorium: Normal Semmes Weinstein monofilament test. Normal tactile sensation bilaterally. Nail Exam: Pt has thick disfigured discolored nails with subungual debris noted bilateral entire nail hallux through fifth toenails.  Except amputation site of bilateral hallux and right second digit pain on palpation to the nails. Ulcer Exam: There is no evidence of ulcer or pre-ulcerative changes or infection. Orthopedic Exam: Muscle tone and strength are WNL. No limitations in general ROM. No crepitus or effusions noted..  Hammer toes 2-5  B/L except for listed amputation.  History of amputation of bilateral hallux and right second digit Skin: No Porokeratosis. No infection or ulcers    Assessment & Plan:   1. Pain due to onychomycosis of toenails of both feet   2. Non-insulin dependent type 2 diabetes mellitus ([place])     Patient was evaluated and treated and all questions answered.    Onychomycosis with pain  -Nails palliatively debrided as below. -Educated on self-care  Procedure: Nail Debridement Rationale: pain  Type of  Debridement: manual, sharp debridement. Instrumentation: Nail nipper, rotary burr. Number of Nails: 7  Procedures and Treatment: Consent by patient was obtained for treatment procedures. The patient understood the discussion of treatment and procedures well. All questions were answered thoroughly reviewed. Debridement of mycotic and hypertrophic toenails, 1 through 5 bilateral and clearing of subungual debris. No ulceration, no infection noted.  Return Visit-Office Procedure: Patient instructed to return to the office for a follow up visit 3 months for continued evaluation and treatment.  Boneta Lucks, DPM    Return in about 3 months (around 04/21/2021) for mayer.

## 2021-04-26 ENCOUNTER — Ambulatory Visit: Payer: Medicare Other | Admitting: Podiatry

## 2021-11-28 ENCOUNTER — Other Ambulatory Visit: Payer: Self-pay

## 2021-11-28 ENCOUNTER — Non-Acute Institutional Stay: Payer: Medicare Other | Admitting: Hospice

## 2021-11-28 DIAGNOSIS — E1122 Type 2 diabetes mellitus with diabetic chronic kidney disease: Secondary | ICD-10-CM

## 2021-11-28 DIAGNOSIS — I10 Essential (primary) hypertension: Secondary | ICD-10-CM

## 2021-11-28 DIAGNOSIS — F17200 Nicotine dependence, unspecified, uncomplicated: Secondary | ICD-10-CM

## 2021-11-28 DIAGNOSIS — Z794 Long term (current) use of insulin: Secondary | ICD-10-CM

## 2021-11-28 NOTE — Progress Notes (Signed)
[facility] Consult Note Telephone: [phone]  Fax: [phone]  PATIENT NAME: Jose Conway [address] [place] [address] [phone] (home)  DOB: December 01, 1960 MRN: [id number]  PRIMARY CARE PROVIDER:    Dr Jules Husbands  REFERRING PROVIDER:   Dr. Madelon Lips  RESPONSIBLE PARTY:   [place]     Name Relation Home Work [place] Other [phone]          I met face to face with patient facility. Palliative Care was asked to follow this patient by consultation request of  Dr. Madelon Lips to address advance care planning, complex medical decision making and goals of care clarification. This is the initial visit.    ASSESSMENT AND / RECOMMENDATIONS:   Advance Care Planning: Our advance care planning conversation included a discussion about:    The value and importance of advance care planning  Difference between Hospice and Palliative care Exploration of goals of care in the event of a sudden injury or illness  Identification and preparation of a healthcare agent  Review and updating or creation of an  advance directive document . Decision not to resuscitate or to de-escalate disease focused treatments due to poor prognosis.  CODE STATUS:Patient affirmed he is a Full code  Goals of Care: Goals include to maximize quality of life and symptom management  Symptom Management/Plan: Type 2 DM: A1c 11.3 07/24/2021. Med changes and currently managed with Humalog, Glipizide, Tradjenta, Lantus.  Diabetic diet, no concentrated sweets.  Repeat A1c next lab day. Close monitoring ACHS HTN: Continue Amlodipine.  Tobacco dependence: Education provided on tobacco cessation.  Patient is currently on nicotine gum. Follow up: Palliative care will continue to follow for complex medical decision making, advance care planning, and clarification of goals. Return 6 weeks or prn. Encouraged to call  provider sooner with any concerns.   Family /Caregiver/Community Supports: Patient in SNF for ongoing care  HOSPICE ELIGIBILITY/DIAGNOSIS: TBD  Chief Complaint: Initial Palliative care visit  HISTORY OF PRESENT ILLNESS:  Jose Conway is a 61 y.o. year old male  with multiple medical conditions including type 2 diabetes mellitus with chronic kidney disease, CKD4, hypertension, PVD.  Patient denies S/S of hypoglycemia/hyper glycemia, denies pain/discomfort.  Nursing reports patient continues to smoke but overall weak no other concerns. History obtained from review of EMR, discussion with primary team, caregiver, family and/or Mr. Kinnaird.  Review and summarization of Epic records shows history from other than patient. Rest of 10 point ROS asked and negative.  I reviewed as needed, available labs, patient records, imaging, studies and related documents from the EMR.    Physical Exam: Constitutional: NAD General: Well groomed, cooperative EYES: anicteric sclera, lids intact, no discharge  ENMT: Moist mucous membrane CV: S1 S2, RRR, no LE edema Pulmonary: LCTA, no increased work of breathing, no cough, Abdomen: active BS + 4 quadrants, soft and non tender GU: no suprapubic tenderness MSK: weakness, ambulatory, also uses wheelchair, self propels Skin: warm and dry, no rashes or wounds on visible skin Neuro:  weakness, otherwise non focal Psych: non-anxious affect Hem/lymph/immuno: no widespread bruising   PAST MEDICAL HISTORY:  Active Ambulatory Problems    Diagnosis Date Noted   Cocaine dependence 02/20/2012   Schizoaffective disorder, depressive type ([place]) 02/20/2012   Osteomyelitis of toe of right foot ([place]) 11/15/2016   Osteomyelitis ([place]) 11/15/2016   Non-insulin dependent type 2 diabetes mellitus ([place]) 11/15/2016   Anemia 11/15/2016   AKI (acute  kidney injury) ([place]) 11/15/2016   Hypertensive cardiomyopathy, without heart failure ([place]) 11/15/2016   Essential hypertension  12/16/2019   CRI (chronic renal insufficiency), stage 3 (moderate) ([place]) 12/16/2019   Murmur, cardiac 12/16/2019   Tobacco abuse 12/16/2019   Debilitated patient 12/16/2019   Resolved Ambulatory Problems    Diagnosis Date Noted   No Resolved Ambulatory Problems   Past Medical History:  Diagnosis Date   CKD (chronic kidney disease) stage 3, GFR 30-59 ml/min (HCC)    Depression    Diabetes mellitus without complication (HCC)    Hypertension     SOCIAL HX:  Social History   Tobacco Use   Smoking status: Every Day    Packs/day: 0.50    Types: Cigarettes   Smokeless tobacco: Never  Substance Use Topics   Alcohol use: Yes     FAMILY HX: No family history on file.    ALLERGIES:  Allergies  Allergen Reactions   Penicillins       PERTINENT MEDICATIONS:  Outpatient Encounter Medications as of 11/28/2021  Medication Sig   amLODipine (NORVASC) 10 MG tablet Take 1 tablet (10 mg total) by mouth every morning. For control of high blood pressure   aspirin EC 81 MG tablet Take 81 mg by mouth daily.   calcitRIOL (ROCALTROL) 0.5 MCG capsule Take 0.5 mcg by mouth daily.   cloNIDine (CATAPRES) 0.1 MG tablet Take 0.1 mg by mouth every 8 (eight) hours as needed.   docusate sodium (COLACE) 100 MG capsule Take 100 mg by mouth 2 (two) times daily.   ferrous sulfate 325 (65 FE) MG EC tablet Take 325 mg by mouth daily.   glipiZIDE (GLUCOTROL XL) 5 MG 24 hr tablet Take 5 mg by mouth daily with breakfast.   linagliptin (TRADJENTA) 5 MG TABS tablet Take 5 mg by mouth daily.   losartan (COZAAR) 25 MG tablet Take 25 mg by mouth daily.   Omega-3 Fatty Acids (FISH OIL) 1000 MG CAPS Take 1,000 mg by mouth daily.   simvastatin (ZOCOR) 10 MG tablet Take 10 mg by mouth at bedtime.   No facility-administered encounter medications on file as of 11/28/2021.   I spent 50 minutes providing this consultation; time includes spent with patient/family, chart review and documentation. More than 50% of the time  in this consultation was spent on care coordination/communication  Thank you for the opportunity to participate in the care of Mr. Sakai.  The palliative care team will continue to follow. Please call our office at [phone] if we can be of additional assistance.   Note: Portions of this note were generated with [employer]. Dictation errors may occur despite best attempts at proofreading.  Teodoro Spray, NP

## 2022-03-05 ENCOUNTER — Emergency Department (HOSPITAL_COMMUNITY)
Admission: EM | Admit: 2022-03-05 | Discharge: 2022-03-05 | Discharge status: Against Medical Advice | Payer: Medicare Other | Attending: Physician Assistant | Admitting: Physician Assistant

## 2022-03-05 ENCOUNTER — Encounter (HOSPITAL_COMMUNITY): Payer: Self-pay | Admitting: Emergency Medicine

## 2022-03-05 DIAGNOSIS — R112 Nausea with vomiting, unspecified: Secondary | ICD-10-CM | POA: Diagnosis present

## 2022-03-05 DIAGNOSIS — Z5321 Procedure and treatment not carried out due to patient leaving prior to being seen by health care provider: Secondary | ICD-10-CM | POA: Diagnosis not present

## 2022-03-05 DIAGNOSIS — R109 Unspecified abdominal pain: Secondary | ICD-10-CM | POA: Insufficient documentation

## 2022-03-05 LAB — COMPREHENSIVE METABOLIC PANEL
ALT: 11 U/L (ref 0–44)
AST: 12 U/L — ABNORMAL LOW (ref 15–41)
Albumin: 3.4 g/dL — ABNORMAL LOW (ref 3.5–5.0)
Alkaline Phosphatase: 93 U/L (ref 38–126)
Anion gap: 13 (ref 5–15)
BUN: 41 mg/dL — ABNORMAL HIGH (ref 6–20)
CO2: 22 mmol/L (ref 22–32)
Calcium: 9.3 mg/dL (ref 8.9–10.3)
Chloride: 101 mmol/L (ref 98–111)
Creatinine, Ser: 8.89 mg/dL — ABNORMAL HIGH (ref 0.61–1.24)
GFR, Estimated: 6 mL/min — ABNORMAL LOW (ref >60)
Glucose, Bld: 160 mg/dL — ABNORMAL HIGH (ref 70–99)
Potassium: 3.7 mmol/L (ref 3.5–5.1)
Sodium: 136 mmol/L (ref 135–145)
Total Bilirubin: 0.3 mg/dL (ref 0.3–1.2)
Total Protein: 7.5 g/dL (ref 6.5–8.1)

## 2022-03-05 LAB — CBC WITH DIFFERENTIAL/PLATELET
Abs Immature Granulocytes: 0.02 10*3/uL (ref 0.00–0.07)
Basophils Absolute: 0.1 10*3/uL (ref 0.0–0.1)
Basophils Relative: 1 %
Eosinophils Absolute: 0.6 10*3/uL — ABNORMAL HIGH (ref 0.0–0.5)
Eosinophils Relative: 6 %
HCT: 43 % (ref 39.0–52.0)
Hemoglobin: 13.7 g/dL (ref 13.0–17.0)
Immature Granulocytes: 0 %
Lymphocytes Relative: 14 %
Lymphs Abs: 1.4 10*3/uL (ref 0.7–4.0)
MCH: 25.8 pg — ABNORMAL LOW (ref 26.0–34.0)
MCHC: 31.9 g/dL (ref 30.0–36.0)
MCV: 80.8 fL (ref 80.0–100.0)
Monocytes Absolute: 0.8 10*3/uL (ref 0.1–1.0)
Monocytes Relative: 8 %
Neutro Abs: 7.4 10*3/uL (ref 1.7–7.7)
Neutrophils Relative %: 71 %
Platelets: 250 10*3/uL (ref 150–400)
RBC: 5.32 MIL/uL (ref 4.22–5.81)
RDW: 14.1 % (ref 11.5–15.5)
WBC: 10.3 10*3/uL (ref 4.0–10.5)
nRBC: 0 % (ref 0.0–0.2)

## 2022-03-05 LAB — LIPASE, BLOOD: Lipase: 43 U/L (ref 11–51)

## 2022-03-05 NOTE — ED Provider Triage Note (Signed)
Emergency Medicine Provider Triage Evaluation Note  Jose Conway , a 61 y.o. male  was evaluated in triage.  Pt complains of abnormal labs.  Found to have a creatinine of 8 on lab work.  States that he has been having nightly episodes of emesis, nausea and abdominal pain.  He has been brought here from a rehab center. States he doesn't want to be here and wants to go back.  Review of Systems  Positive: Vomiting Negative: Chest pain  Physical Exam  BP (!) 148/77 (BP Location: Right Arm)   Pulse 79   Temp 98.2 F (36.8 C) (Oral)   Resp 16   SpO2 97%  Gen:   Awake, no distress   Resp:  Normal effort  MSK:   Moves extremities without difficulty  Other:    Medical Decision Making  Medically screening exam initiated at 2:11 PM.  Appropriate orders placed.  Jose Conway was informed that the remainder of the evaluation will be completed by another provider, this initial triage assessment does not replace that evaluation, and the importance of remaining in the ED until their evaluation is complete.  Labs ordered   Delia Heady, [place] 03/05/22 [id number]

## 2022-03-05 NOTE — ED Notes (Signed)
Patient left ED.  

## 2022-03-05 NOTE — ED Triage Notes (Addendum)
Pt transported from [facility] by [facility] for abnormal labs 5/19, results showing kidney function 41/8.77. pt c/o upset stomach x 2 weeks, emesis each night, +nausea. GCS 15. Denies pain. Drinking/urinating normally

## 2022-06-30 ENCOUNTER — Emergency Department (HOSPITAL_COMMUNITY): Payer: Medicare Other

## 2022-06-30 ENCOUNTER — Inpatient Hospital Stay (HOSPITAL_COMMUNITY)
Admission: EM | Admit: 2022-06-30 | Discharge: 2022-07-15 | DRG: 951 | Disposition: E | Payer: Medicare Other | Attending: Student in an Organized Health Care Education/Training Program | Admitting: Student in an Organized Health Care Education/Training Program

## 2022-06-30 ENCOUNTER — Other Ambulatory Visit: Payer: Self-pay

## 2022-06-30 DIAGNOSIS — N189 Chronic kidney disease, unspecified: Secondary | ICD-10-CM | POA: Diagnosis not present

## 2022-06-30 DIAGNOSIS — I120 Hypertensive chronic kidney disease with stage 5 chronic kidney disease or end stage renal disease: Secondary | ICD-10-CM | POA: Diagnosis present

## 2022-06-30 DIAGNOSIS — F32A Depression, unspecified: Secondary | ICD-10-CM | POA: Diagnosis present

## 2022-06-30 DIAGNOSIS — Z635 Disruption of family by separation and divorce: Secondary | ICD-10-CM | POA: Diagnosis not present

## 2022-06-30 DIAGNOSIS — R7989 Other specified abnormal findings of blood chemistry: Secondary | ICD-10-CM

## 2022-06-30 DIAGNOSIS — Z20822 Contact with and (suspected) exposure to covid-19: Secondary | ICD-10-CM | POA: Diagnosis present

## 2022-06-30 DIAGNOSIS — Z789 Other specified health status: Secondary | ICD-10-CM

## 2022-06-30 DIAGNOSIS — N185 Chronic kidney disease, stage 5: Secondary | ICD-10-CM | POA: Diagnosis present

## 2022-06-30 DIAGNOSIS — R4182 Altered mental status, unspecified: Secondary | ICD-10-CM | POA: Diagnosis not present

## 2022-06-30 DIAGNOSIS — Z7984 Long term (current) use of oral hypoglycemic drugs: Secondary | ICD-10-CM

## 2022-06-30 DIAGNOSIS — Z66 Do not resuscitate: Secondary | ICD-10-CM | POA: Diagnosis present

## 2022-06-30 DIAGNOSIS — E1122 Type 2 diabetes mellitus with diabetic chronic kidney disease: Secondary | ICD-10-CM | POA: Diagnosis present

## 2022-06-30 DIAGNOSIS — Z515 Encounter for palliative care: Secondary | ICD-10-CM | POA: Diagnosis not present

## 2022-06-30 DIAGNOSIS — D72829 Elevated white blood cell count, unspecified: Secondary | ICD-10-CM

## 2022-06-30 DIAGNOSIS — E1169 Type 2 diabetes mellitus with other specified complication: Secondary | ICD-10-CM | POA: Diagnosis not present

## 2022-06-30 DIAGNOSIS — I129 Hypertensive chronic kidney disease with stage 1 through stage 4 chronic kidney disease, or unspecified chronic kidney disease: Secondary | ICD-10-CM | POA: Diagnosis not present

## 2022-06-30 DIAGNOSIS — G9341 Metabolic encephalopathy: Secondary | ICD-10-CM | POA: Diagnosis present

## 2022-06-30 DIAGNOSIS — Z79899 Other long term (current) drug therapy: Secondary | ICD-10-CM | POA: Diagnosis not present

## 2022-06-30 DIAGNOSIS — E875 Hyperkalemia: Secondary | ICD-10-CM | POA: Diagnosis present

## 2022-06-30 DIAGNOSIS — J9601 Acute respiratory failure with hypoxia: Secondary | ICD-10-CM | POA: Diagnosis present

## 2022-06-30 DIAGNOSIS — E872 Acidosis, unspecified: Secondary | ICD-10-CM | POA: Diagnosis present

## 2022-06-30 DIAGNOSIS — N179 Acute kidney failure, unspecified: Secondary | ICD-10-CM | POA: Diagnosis present

## 2022-06-30 DIAGNOSIS — E860 Dehydration: Secondary | ICD-10-CM

## 2022-06-30 DIAGNOSIS — Z794 Long term (current) use of insulin: Secondary | ICD-10-CM | POA: Diagnosis not present

## 2022-06-30 DIAGNOSIS — Z7982 Long term (current) use of aspirin: Secondary | ICD-10-CM

## 2022-06-30 DIAGNOSIS — N19 Unspecified kidney failure: Secondary | ICD-10-CM

## 2022-06-30 DIAGNOSIS — F17210 Nicotine dependence, cigarettes, uncomplicated: Secondary | ICD-10-CM | POA: Diagnosis present

## 2022-06-30 DIAGNOSIS — R0989 Other specified symptoms and signs involving the circulatory and respiratory systems: Secondary | ICD-10-CM

## 2022-06-30 DIAGNOSIS — I10 Essential (primary) hypertension: Secondary | ICD-10-CM

## 2022-06-30 DIAGNOSIS — Z5900 Homelessness unspecified: Secondary | ICD-10-CM

## 2022-06-30 DIAGNOSIS — R030 Elevated blood-pressure reading, without diagnosis of hypertension: Secondary | ICD-10-CM

## 2022-06-30 DIAGNOSIS — R799 Abnormal finding of blood chemistry, unspecified: Secondary | ICD-10-CM

## 2022-06-30 DIAGNOSIS — T68XXXA Hypothermia, initial encounter: Secondary | ICD-10-CM

## 2022-06-30 LAB — CBC WITH DIFFERENTIAL/PLATELET
Abs Immature Granulocytes: 4.12 10*3/uL — ABNORMAL HIGH (ref 0.00–0.07)
Basophils Absolute: 0.3 10*3/uL — ABNORMAL HIGH (ref 0.0–0.1)
Basophils Relative: 1 %
Eosinophils Absolute: 0.1 10*3/uL (ref 0.0–0.5)
Eosinophils Relative: 0 %
HCT: 30.7 % — ABNORMAL LOW (ref 39.0–52.0)
Hemoglobin: 10.2 g/dL — ABNORMAL LOW (ref 13.0–17.0)
Immature Granulocytes: 12 %
Lymphocytes Relative: 3 %
Lymphs Abs: 0.9 10*3/uL (ref 0.7–4.0)
MCH: 26 pg (ref 26.0–34.0)
MCHC: 33.2 g/dL (ref 30.0–36.0)
MCV: 78.3 fL — ABNORMAL LOW (ref 80.0–100.0)
Monocytes Absolute: 1.1 10*3/uL — ABNORMAL HIGH (ref 0.1–1.0)
Monocytes Relative: 3 %
Neutro Abs: 26.8 10*3/uL — ABNORMAL HIGH (ref 1.7–7.7)
Neutrophils Relative %: 81 %
Platelets: 316 10*3/uL (ref 150–400)
RBC: 3.92 MIL/uL — ABNORMAL LOW (ref 4.22–5.81)
RDW: 15.1 % (ref 11.5–15.5)
WBC: 33.3 10*3/uL — ABNORMAL HIGH (ref 4.0–10.5)
nRBC: 0.9 % — ABNORMAL HIGH (ref 0.0–0.2)

## 2022-06-30 LAB — COMPREHENSIVE METABOLIC PANEL
ALT: 25 U/L (ref 0–44)
AST: 42 U/L — ABNORMAL HIGH (ref 15–41)
Albumin: 3.3 g/dL — ABNORMAL LOW (ref 3.5–5.0)
Alkaline Phosphatase: 110 U/L (ref 38–126)
BUN: 223 mg/dL — ABNORMAL HIGH (ref 6–20)
CO2: <7 mmol/L — ABNORMAL LOW (ref 22–32)
Calcium: 6.7 mg/dL — ABNORMAL LOW (ref 8.9–10.3)
Chloride: 103 mmol/L (ref 98–111)
Creatinine, Ser: 26.61 mg/dL — ABNORMAL HIGH (ref 0.61–1.24)
GFR, Estimated: 2 mL/min — ABNORMAL LOW (ref >60)
Glucose, Bld: 165 mg/dL — ABNORMAL HIGH (ref 70–99)
Potassium: 6.3 mmol/L (ref 3.5–5.1)
Sodium: 143 mmol/L (ref 135–145)
Total Bilirubin: 0.6 mg/dL (ref 0.3–1.2)
Total Protein: 8.5 g/dL — ABNORMAL HIGH (ref 6.5–8.1)

## 2022-06-30 LAB — I-STAT ARTERIAL BLOOD GAS, ED
Acid-base deficit: 30 mmol/L — ABNORMAL HIGH (ref 0.0–2.0)
Bicarbonate: 2.5 mmol/L — ABNORMAL LOW (ref 20.0–28.0)
Calcium, Ion: 0.87 mmol/L — CL (ref 1.15–1.40)
HCT: 27 % — ABNORMAL LOW (ref 39.0–52.0)
Hemoglobin: 9.2 g/dL — ABNORMAL LOW (ref 13.0–17.0)
O2 Saturation: 88 %
Patient temperature: 93.9
Potassium: 6.3 mmol/L (ref 3.5–5.1)
Sodium: 141 mmol/L (ref 135–145)
TCO2: <5 mmol/L — ABNORMAL LOW (ref 22–32)
pCO2 arterial: 16 mmHg — CL (ref 32–48)
pH, Arterial: 6.773 — CL (ref 7.35–7.45)
pO2, Arterial: 91 mmHg (ref 83–108)

## 2022-06-30 LAB — LACTIC ACID, PLASMA
Lactic Acid, Venous: 4.4 mmol/L (ref 0.5–1.9)
Lactic Acid, Venous: 4.7 mmol/L (ref 0.5–1.9)

## 2022-06-30 LAB — SARS CORONAVIRUS 2 BY RT PCR: SARS Coronavirus 2 by RT PCR: NEGATIVE

## 2022-06-30 LAB — URINALYSIS, ROUTINE W REFLEX MICROSCOPIC
Bilirubin Urine: NEGATIVE
Glucose, UA: NEGATIVE mg/dL
Ketones, ur: 5 mg/dL — AB
Nitrite: NEGATIVE
Protein, ur: >=300 mg/dL — AB
Specific Gravity, Urine: 1.011 (ref 1.005–1.030)
pH: 5 (ref 5.0–8.0)

## 2022-06-30 LAB — TROPONIN I (HIGH SENSITIVITY): Troponin I (High Sensitivity): 306 ng/L (ref <18)

## 2022-06-30 LAB — CBG MONITORING, ED: Glucose-Capillary: 153 mg/dL — ABNORMAL HIGH (ref 70–99)

## 2022-06-30 MED ORDER — DEXTROSE 50 % IV SOLN
25.0000 g | Freq: Once | INTRAVENOUS | Status: AC
  Administered 2022-06-30: 25 g via INTRAVENOUS
  Filled 2022-06-30: qty 50

## 2022-06-30 MED ORDER — DIAZEPAM 5 MG/ML IJ SOLN: 5.0000 mg | INTRAMUSCULAR | Status: DC | PRN

## 2022-06-30 MED ORDER — BIOTENE DRY MOUTH MT LIQD: 15.0000 mL | Freq: Two times a day (BID) | OROMUCOSAL | Status: DC

## 2022-06-30 MED ORDER — CALCIUM GLUCONATE 10 % IV SOLN
1.0000 g | Freq: Once | INTRAVENOUS | Status: AC
  Administered 2022-06-30: 1 g via INTRAVENOUS
  Filled 2022-06-30: qty 10

## 2022-06-30 MED ORDER — SODIUM CHLORIDE 0.9 % IV SOLN
2.0000 g | Freq: Once | INTRAVENOUS | Status: AC
  Administered 2022-06-30: 2 g via INTRAVENOUS
  Filled 2022-06-30: qty 12.5

## 2022-06-30 MED ORDER — HYDROMORPHONE HCL 1 MG/ML IJ SOLN
0.5000 mg | INTRAMUSCULAR | Status: DC | PRN
  Administered 2022-06-30: 0.5 mg via INTRAVENOUS

## 2022-06-30 MED ORDER — VANCOMYCIN HCL IN DEXTROSE 1-5 GM/200ML-% IV SOLN: 1000.0000 mg | Freq: Once | INTRAVENOUS | Status: DC

## 2022-06-30 MED ORDER — SODIUM BICARBONATE 8.4 % IV SOLN
50.0000 meq | Freq: Once | INTRAVENOUS | Status: AC
  Administered 2022-06-30: 50 meq via INTRAVENOUS
  Filled 2022-06-30: qty 50

## 2022-06-30 MED ORDER — GLYCOPYRROLATE 0.2 MG/ML IJ SOLN: 0.3000 mg | INTRAMUSCULAR | Status: DC | PRN

## 2022-06-30 MED ORDER — LACTATED RINGERS IV BOLUS (SEPSIS): 500.0000 mL | Freq: Once | INTRAVENOUS | Status: DC

## 2022-06-30 MED ORDER — SODIUM BICARBONATE 8.4 % IV SOLN
INTRAVENOUS | Status: DC
  Filled 2022-06-30: qty 1000

## 2022-06-30 MED ORDER — ONDANSETRON HCL 4 MG/2ML IJ SOLN: 4.0000 mg | Freq: Four times a day (QID) | INTRAMUSCULAR | Status: DC | PRN

## 2022-06-30 MED ORDER — SODIUM CHLORIDE 0.9 % IV BOLUS
1000.0000 mL | Freq: Once | INTRAVENOUS | Status: AC
  Administered 2022-06-30: 1000 mL via INTRAVENOUS

## 2022-06-30 MED ORDER — POLYVINYL ALCOHOL 1.4 % OP SOLN: 1.0000 [drp] | Freq: Four times a day (QID) | OPHTHALMIC | Status: DC | PRN

## 2022-06-30 MED ORDER — ACETAMINOPHEN 650 MG RE SUPP: 650.0000 mg | Freq: Four times a day (QID) | RECTAL | Status: DC | PRN

## 2022-06-30 MED ORDER — INSULIN ASPART 100 UNIT/ML IJ SOLN
6.0000 [IU] | Freq: Once | INTRAMUSCULAR | Status: AC
  Administered 2022-06-30: 6 [IU] via INTRAVENOUS

## 2022-06-30 MED ORDER — DIPHENHYDRAMINE HCL 50 MG/ML IJ SOLN: 12.5000 mg | INTRAMUSCULAR | Status: DC | PRN

## 2022-06-30 MED ORDER — HYDROMORPHONE BOLUS VIA INFUSION
1.0000 mg | INTRAVENOUS | Status: DC | PRN
  Administered 2022-06-30: 2 mg via INTRAVENOUS

## 2022-06-30 MED ORDER — SODIUM CHLORIDE 0.9 % IV SOLN
1.0000 mg/h | INTRAVENOUS | Status: DC
  Administered 2022-06-30: 1 mg/h via INTRAVENOUS
  Filled 2022-06-30: qty 5

## 2022-06-30 MED ORDER — HALOPERIDOL LACTATE 5 MG/ML IJ SOLN: 2.0000 mg | Freq: Four times a day (QID) | INTRAMUSCULAR | Status: DC | PRN

## 2022-06-30 MED ORDER — HYDROMORPHONE HCL 1 MG/ML IJ SOLN
INTRAMUSCULAR | Status: AC
  Filled 2022-06-30: qty 1

## 2022-06-30 MED ORDER — LACTATED RINGERS IV SOLN: INTRAVENOUS | Status: DC

## 2022-06-30 MED ORDER — LACTATED RINGERS IV BOLUS (SEPSIS): 1000.0000 mL | Freq: Once | INTRAVENOUS | Status: DC

## 2022-06-30 MED ORDER — LACTATED RINGERS IV BOLUS (SEPSIS)
1000.0000 mL | Freq: Once | INTRAVENOUS | Status: AC
  Administered 2022-06-30: 1000 mL via INTRAVENOUS

## 2022-06-30 MED ORDER — VANCOMYCIN HCL 1500 MG/300ML IV SOLN
1500.0000 mg | Freq: Once | INTRAVENOUS | Status: AC
  Administered 2022-06-30: 1500 mg via INTRAVENOUS
  Filled 2022-06-30: qty 300

## 2022-06-30 MED ORDER — VANCOMYCIN VARIABLE DOSE PER UNSTABLE RENAL FUNCTION (PHARMACIST DOSING): Status: DC

## 2022-06-30 MED ORDER — ONDANSETRON 4 MG PO TBDP: 4.0000 mg | ORAL_TABLET | Freq: Four times a day (QID) | ORAL | Status: DC | PRN

## 2022-06-30 MED ORDER — GLYCOPYRROLATE 0.2 MG/ML IJ SOLN
0.4000 mg | INTRAMUSCULAR | Status: DC | PRN
  Administered 2022-06-30: 0.4 mg via INTRAVENOUS
  Filled 2022-06-30: qty 2

## 2022-06-30 MED ORDER — ACETAMINOPHEN 325 MG PO TABS: 650.0000 mg | ORAL_TABLET | Freq: Four times a day (QID) | ORAL | Status: DC | PRN

## 2022-06-30 MED ORDER — SODIUM CHLORIDE 0.9 % IV SOLN: 1.0000 g | INTRAVENOUS | Status: DC

## 2022-06-30 MED ORDER — MORPHINE SULFATE (PF) 2 MG/ML IV SOLN
2.0000 mg | Freq: Once | INTRAVENOUS | Status: AC
  Administered 2022-06-30: 2 mg via INTRAVENOUS
  Filled 2022-06-30: qty 1

## 2022-06-30 MED ORDER — HALOPERIDOL LACTATE 2 MG/ML PO CONC: 2.0000 mg | Freq: Four times a day (QID) | ORAL | Status: DC | PRN

## 2022-06-30 MED ORDER — METRONIDAZOLE 500 MG/100ML IV SOLN
500.0000 mg | Freq: Once | INTRAVENOUS | Status: AC
  Administered 2022-06-30: 500 mg via INTRAVENOUS
  Filled 2022-06-30: qty 100

## 2022-06-30 MED ORDER — HALOPERIDOL 1 MG PO TABS: 2.0000 mg | ORAL_TABLET | Freq: Four times a day (QID) | ORAL | Status: DC | PRN

## 2022-07-05 LAB — CULTURE, BLOOD (ROUTINE X 2)
Culture: NO GROWTH
Culture: NO GROWTH

## 2022-07-15 NOTE — H&P (Addendum)
Date: 05-Jul-2022               Patient Name:  Jose Conway MRN: [id number]  DOB: Nov 28, 1960 Age / Sex: 61 y.o., male   PCP: Patient, No Pcp Per         Medical Service: Internal Medicine Teaching Service         Attending Physician: Dr. Evette Doffing, Mallie Mussel, *    First Contact: Dr. Stormy Fabian, MD Pager: [phone]  Second Contact: Dr. Linwood Dibbles, MD Pager: [phone]       After Hours (After 5p/  First Contact Pager: [phone]  weekends / holidays): Second Contact Pager: [phone]   Chief Complaint: AMS  History of Present Illness: Patient is a 61 year old male with a PMHx of HTN and DM who presents to the hospital from SNF due to [place]. Per ED note, patient came to the hospital not verbally responsive. No report of fever or fall. His creatinine in 2023 was 8.9. Labs today demonstrate creatinine of 26.6 and elevated potassium of 6.3. When he was seen in the hospital in May, he left refusing care. Per nephrology, they note that the patient's facilty states that he has refused to see nephrologist in the past and also refused dialysis when offered. At this time, he does not have capacity but they will follow his wishes when he did have capacity and continue with not offering dialysis at this time and discussing palliative care. Patient has no next of kin per his facility.   On assessment this AM, patient was not verbally responsive. He was heavily breathing with foam from his mouth.  Meds:  Current Meds  Medication Sig   acetaminophen (TYLENOL) 325 MG tablet Take 650 mg by mouth every 6 (six) hours as needed for pain or fever.   amLODipine (NORVASC) 5 MG tablet Take 7.5 mg by mouth daily.   calcitRIOL (ROCALTROL) 0.5 MCG capsule Take 0.5 mcg by mouth daily.   LANTUS SOLOSTAR 100 UNIT/ML Solostar Pen Inject 32 Units into the skin at bedtime.   linagliptin (TRADJENTA) 5 MG TABS tablet Take 5 mg by mouth daily.   Morphine Sulfate (MORPHINE CONCENTRATE) 10 mg / 0.5 ml concentrated  solution Take 0.25 mLs by mouth every 4 (four) hours as needed for shortness of breath.   ondansetron (ZOFRAN) 4 MG tablet Take 4 mg by mouth 2 (two) times daily as needed for nausea/vomiting.   polyethylene glycol (MIRALAX / GLYCOLAX) 17 g packet Take 17 g by mouth 2 (two) times a week. Tues, Friday   simvastatin (ZOCOR) 10 MG tablet Take 10 mg by mouth at bedtime.   sodium bicarbonate 650 MG tablet Take 650 mg by mouth 2 (two) times daily.     Allergies: Allergies as of July 05, 2022 - Review Complete 07/05/2022  Allergen Reaction Noted   Penicillins Other (See Comments) 12/16/2019   Past Medical History:  Diagnosis Date   CKD (chronic kidney disease) stage 3, GFR 30-59 ml/min (HCC)    Depression    Diabetes mellitus without complication ([place])    Hypertension     Family History: unable to assess  Social History: unable to assess  Review of Systems: A complete ROS was negative except as per HPI.   Physical Exam: Blood pressure (!) 112/43, pulse 100, temperature 98 F (36.7 C), resp. rate (!) 24, height 5\' 11"  (1.803 m), weight 90.7 kg, SpO2 (!) 86 %.  General: Non responsive to voice in bed, labored breathing. Acutely distressed CV:  Oscillating from bradycardic and tachycardic. No LE edema Pulmonary: Lungs difficult to examine. Much increased effort. Abdominal: Soft, nontender, nondistended. Normal bowel sounds. Extremities: Radial and DP pulses 2+ and symmetric. Could not assess ROM. Skin: Warm and dry. Patient has frothing at the mouth Neuro: A&Ox0, eyes open but not tracking movement. Somnolent, unable to follow commands.  EKG: Sinus rhythm Prolonged PR interval Probable left ventricular hypertrophy Non-specific ST-t changes Prolonged QT interval Prominent/peaked t waves v3-v4  CXR: No acute cardiopulmonary abnormality.  CT head: No acute intracranial abnormality.  Assessment & Plan by Problem: Principal Problem:   Acute on chronic renal failure (HCC)  #Acute  metabolic encephalopathy #Acute on chronic renal failure #Severe metabolic acidosis/lactic acidosis #Acute hypoxic respiratory failure #Hyperkalemia #HTN #TIIDM Patient has a history of CKD III in 2019. Also has a history of other chronic uncontrolled medical conditions. On admission, he was found to have Cr of 26.6, increased from 8.9 in May 2023. Current K is 6.6. Critical care evaluated the patient and he is found to have critical ABG for pH of 6.7, PCO2 of 16 indicating severe metabolic acidosis and lactic acidosis. Unable to ventilate him any better. Based on previous discussion with Nephrology, patient did not want dialysis. Since he is currently does not have capacity, the decision was made to respect his previous wishes and not start HD. Due to his critical illness and desire for minimal interventions, further treatment with HD was deemed futile. No next of kin was able to be reached. Patient was made DNR/DNI and was transitioned to comfort care in consultation with PCCM and ER provider. During our evaluation, he was verbally unresponsive with labored breathing with accessory muscle use. Palliative care team consulted to assist with comfort care orders. -Palliative team consulted, appreciate recs -Comfort care orders: -Started Valium 5 mg IV Q4H for agitation at EOL -Started Benadryl 12.5 mg IV Q4H PRN for itching -Started Haldol 2 mg oral, sublingual, or IV Q6H PRN for agitation -Started Dilaudid 1-2 mg IV every 15 minutes PRN for pain, dyspnea -Started Zofran 4 mg oral or IV Q6H for nausea -Started Glycopyrrolate 0.4 mg Q4H PRN for secretion at EOL -Anticipate hospital death in hours to days.    Signed: Stormy Fabian, MD 07/03/2022, 2:08 PM  Pager: [phone] After 5pm on weekdays and 1pm on weekends: On Call pager: [phone]

## 2022-07-15 NOTE — ED Notes (Signed)
Pt post mortem care provided. Pt toe tag identification, ID labels filled out, pt placed in body bag. Belongings labeled and with pt. Pt covered with a sheet and transported to the morgue via stretcher.

## 2022-07-15 NOTE — ED Notes (Signed)
Pt noted to have thick brown and red secretions in mouth. PT lying on right side. Pt suctioned at this time. Provider aware.

## 2022-07-15 NOTE — Consult Note (Signed)
I have reviewed this case in detail and discussed with the primary team. I have also attempted to determine if there is an available and willing surrogate decision maker without any success. At this time it is my medical opinion that this patient has irreversible disease and that he is actively dying. He will not at this time or in the future be able to participate in his own medical decision making. I agree with the primary team that comfort care is the compassionate and humane intervention and also medically appropriate in line with all ethical and best practice standards.  Will place orders for terminal care. Anticipate hospital death.  Lane Hacker, DO Palliative Medicine

## 2022-07-15 NOTE — ED Triage Notes (Addendum)
Pt brought to ED by [facility] from [facility] with c/o altered mental status. Per staff, pt is aggressive at  baseline today but this morning pt was not at baseline, noticing differences in pts breathing and that pt was "not acting normal". Pt answers questions appropriately at triage.   EMS Vitals BP 206/80 HR 90 CBG 165 SPO2 100% RA

## 2022-07-15 NOTE — Consult Note (Signed)
NAME:  Jose Conway, MRN:  [id number], DOB:  October 14, 1961, LOS: 0 ADMISSION DATE:  28-Jul-2022, CONSULTATION DATE:  Jul 28, 2022 REFERRING MD:  Ashok Cordia, MD ED, CHIEF COMPLAINT: Encephalopathy  History of Present Illness:  61 year old man reportedly previously homeless now currently a resident of [facility] facility transported to the ED with encephalopathy.  Labs notable for BUN greater than 200 in the creatinine of 26.  Acute on chronic with most recent creatinine 8.9.  CKD stage V.  Per discussion with nephrology he has refused dialysis in the past.  He was referred to palliative care via his nephrologist earlier in 2023.  There is a note from February.  We attempted to contact the palliative care office but they were not particularly helpful, unable provide much more information.  [place].  Spoke with supervisor on call.  Hx discharge he has no next of kin.  No family.  No other alternative contacts.  No surrogate decision maker.  He was homeless prior to arrival at this facility.  His pH returned at 6.7 with PCO2 of 16.  It appears he is actively dying.  I discussion with nephrology after determining is not a dialysis candidate we both independently agree patient to be DNR and CODE STATUS was changed to DNR.  Pertinent  Medical History  CKD stage V  Significant Hospital Events: Including procedures, antibiotic start and stop dates in addition to other pertinent events   07-28-2022 admitted from [facility] with encephalopathy found to have acute on chronic renal failure and severe metabolic acidosis with lactic acidosis and additional anion gap/nongap acidosis related to severe renal failure  Interim History / Subjective:    Objective   Blood pressure (!) 156/44, pulse 100, temperature (!) 95 F (35 C), resp. rate 19, SpO2 95 %.        Intake/Output Summary (Last 24 hours) at July 28, 2022 1036 Last data filed at 2022-07-28 0856 Gross per 24 hour  Intake 999 ml  Output --   Net 999 ml   There were no vitals filed for this visit.  Examination: General: Chronically ill-appearing, lying in stretcher HENT: Roving eye movements, atraumatic, dry mucous membranes Lungs: Clear, tachypneic with deep breaths Cardiovascular: Tachycardic, warm Abdomen: Nondistended, bowel sounds present Extremities: No edema, warm Neuro: Does not reliably follow commands, continues opens eyes and occasionally rolls over, encephalopathic   Resolved Hospital Problem list   N/a  Assessment & Plan:  Acute on chronic renal failure with severe metabolic acidosis: In the setting of renal failure.  With lactic acidosis possibly related to sepsis versus dehydration.  However the mild lactic acidosis of 4 does not account for the degree of acidemia and anion gap as there is another issue going on related to acute on chronic renal failure.  He is not dialysis candidate per nephrology.  Initial pH of 6.7.  With PCO2 of 16.  Unable to ventilate him any better.  Given not dialysis candidate, any additional care is futile.  With his severe acidemia he is likely to die imminently. -- Recommend comfort measures, palliative care per admitting provider -- DNR instituted with nephrology given her independent recommendation for DNR status given futility of ongoing care in the setting of lack of next of kin or surrogate decision maker, see iPAL note for discussion  Best Practice (right click and "Reselect all SmartList Selections" daily)   Diet/type: NPO DVT prophylaxis: not indicated GI prophylaxis: N/A Lines: N/A Foley:  N/A Code Status:  DNR Last date of  multidisciplinary goals of care discussion [see iPAL note 9/16]  Labs   CBC: Recent Labs  Lab 2022/07/06 0700 2022-07-06 0923  WBC 33.3*  --   NEUTROABS 26.8*  --   HGB 10.2* 9.2*  HCT 30.7* 27.0*  MCV 78.3*  --   PLT 316  --     Basic Metabolic Panel: Recent Labs  Lab Jul 06, 2022 0700 07/06/2022 0923  NA 143 141  K 6.3* 6.3*  CL 103  --    CO2 <7*  --   GLUCOSE 165*  --   BUN 223*  --   CREATININE 26.61*  --   CALCIUM 6.7*  --    GFR: CrCl cannot be calculated (Unknown ideal weight.). Recent Labs  Lab July 06, 2022 0700 07/06/22 0710  WBC 33.3*  --   LATICACIDVEN  --  4.4*    Liver Function Tests: Recent Labs  Lab 2022-07-06 0700  AST 42*  ALT 25  ALKPHOS 110  BILITOT 0.6  PROT 8.5*  ALBUMIN 3.3*   No results for input(s): "LIPASE", "AMYLASE" in the last 168 hours. No results for input(s): "AMMONIA" in the last 168 hours.  ABG    Component Value Date/Time   PHART 6.773 (LL) July 06, 2022 0923   PCO2ART 16.0 (LL) 2022/07/06 0923   PO2ART 91 07-06-22 0923   HCO3 2.5 (L) 2022/07/06 0923   TCO2 <5 (L) 07/06/2022 0923   ACIDBASEDEF 30.0 (H) Jul 06, 2022 0923   O2SAT 88 07-06-22 0923     Coagulation Profile: No results for input(s): "INR", "PROTIME" in the last 168 hours.  Cardiac Enzymes: No results for input(s): "CKTOTAL", "CKMB", "CKMBINDEX", "TROPONINI" in the last 168 hours.  HbA1C: Hgb A1c MFr Bld  Date/Time Value Ref Range Status  11/15/2016 01:54 AM 7.8 (H) 4.8 - 5.6 % Final    Comment:    (NOTE)         Pre-diabetes: 5.7 - 6.4         Diabetes: >6.4         Glycemic control for adults with diabetes: <7.0   02/20/2012 08:02 PM 9.3 (H) <5.7 % Final    Comment:    (NOTE)                                                                       According to the ADA Clinical Practice Recommendations for 2011, when HbA1c is used as a screening test:  >=6.5%   Diagnostic of Diabetes Mellitus           (if abnormal result is confirmed) 5.7-6.4%   Increased risk of developing Diabetes Mellitus References:Diagnosis and Classification of Diabetes Mellitus,Diabetes EQAS,3419,62(IWLNL 1):S62-S69 and Standards of Medical Care in         Diabetes - 2011,Diabetes GXQJ,1941,74 (Suppl 1):S11-S61.    CBG: Recent Labs  Lab 07-06-2022 0654  GLUCAP 153*    Review of Systems:   Unobtainable due to  patient factors, encephalopathy  Past Medical History:  He,  has a past medical history of CKD (chronic kidney disease) stage 3, GFR 30-59 ml/min ([place]), Depression, Diabetes mellitus without complication ([place]), and Hypertension.   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 He has been smoking an average of .5 packs per day. He has never used smokeless tobacco. He reports current alcohol use. He reports that he does not use drugs.   Family History:  His family history is not on file.   Allergies Allergies  Allergen Reactions   Penicillins      Home Medications  Prior to Admission medications   Medication Sig Start Date End Date Taking? Authorizing Provider  amLODipine (NORVASC) 10 MG tablet Take 1 tablet (10 mg total) by mouth every morning. For control of high blood pressure 02/25/12   Darrol Jump, MD  aspirin EC 81 MG tablet Take 81 mg by mouth daily.    [provider]  calcitRIOL (ROCALTROL) 0.5 MCG capsule Take 0.5 mcg by mouth daily.    [provider]  cloNIDine (CATAPRES) 0.1 MG tablet Take 0.1 mg by mouth every 8 (eight) hours as needed.    [provider]  docusate sodium (COLACE) 100 MG capsule Take 100 mg by mouth 2 (two) times daily.    [provider]  ferrous sulfate 325 (65 FE) MG EC tablet Take 325 mg by mouth daily.    [provider]  glipiZIDE (GLUCOTROL XL) 5 MG 24 hr tablet Take 5 mg by mouth daily with breakfast.    [provider]  linagliptin (TRADJENTA) 5 MG TABS tablet Take 5 mg by mouth daily.    [provider]  losartan (COZAAR) 25 MG tablet Take 25 mg by mouth daily.    [provider]  Omega-3 Fatty Acids (FISH OIL) 1000 MG CAPS Take 1,000 mg by mouth daily.    [provider]  simvastatin  (ZOCOR) 10 MG tablet Take 10 mg by mouth at bedtime. 04/14/20   [provider]     Critical care time: n/a   [name], MD See Shea Evans for contact info

## 2022-07-15 NOTE — ED Notes (Addendum)
Date and time results received: 07/25/2022 1111   Test: Lactic acid Critical Value: 4.7  Name of Provider Notified: Dr. Ashok Cordia

## 2022-07-15 NOTE — Progress Notes (Signed)
Pharmacy Antibiotic Note  Jose Conway is a 61 y.o. male admitted on 17-Jul-2022 presenting from nursing facility for AMS, concern for sepsis.  Pharmacy has been consulted for vancomycin and cefepime dosing.  Vancomycin 1500 mg IV x 1 already given, cefepime 2g IV x 1 CKD V, current SCr 26.61  Plan: Continue vancomycin tx with variable dosing based on renal function/level Cefepime 1g IV q 24h Monitor renal function, Cx and clinical progression to narrow Vancomycin random level as needed     Temp (24hrs), Avg:94.6 F (34.8 C), Min:93.9 F (34.4 C), Max:95.7 F (35.4 C)  Recent Labs  Lab 07/17/22 0700 July 17, 2022 0710  WBC 33.3*  --   CREATININE 26.61*  --   LATICACIDVEN  --  4.4*    CrCl cannot be calculated (Unknown ideal weight.).    Allergies  Allergen Reactions   Penicillins     Bertis Ruddy, PharmD Clinical Pharmacist ED Pharmacist Phone # [phone] 07-17-2022 11:10 AM

## 2022-07-15 NOTE — ED Notes (Signed)
DNR wristband placed on pt.

## 2022-07-15 NOTE — ED Notes (Signed)
Dr. Ashok Cordia aware of pt clinical status at this time.

## 2022-07-15 NOTE — Consult Note (Cosign Needed Addendum)
Consultation Note Date: 07/23/2022   Patient Name: Jose Conway  DOB: May 06, 1961  MRN: [id number]  Age / Sex: 61 y.o., male  PCP: Patient, No Pcp Per Referring Physician: Axel Filler, *  Reason for Consultation: Establishing goals of care, Non pain symptom management, Pain control, Psychosocial/spiritual support, and Terminal Care, "esrd, dnr"  HPI/Patient Profile: 61 y.o. male  with past medical history of HTN, advanced CKD, and DM presented to ED from [place] with [facility]. Nephrology was consulted - patient had refused care with them in the past and refused dialysis. Upon further evaluation patient was noted with severe uremic frost, metabolic acidosis, and hyperkalemia due to acute on chronic kidney disease stage 5. He was not a candidate for renal replacement therapy and his condition was not reversible. Patient was unresponsive and unable to participate in conversations or [place] in ED. Multiple attempts by providers to locate patient's NOK/surrogate decision maker without success. Patient was homeless prior to placement at [facility] per notes. Patient was admitted on 07-23-22 for EOL care.   Clinical Assessment and Goals of Care: I have reviewed medical records including EPIC notes, labs, and imaging. Received report from primary RN - acute concern of air hunger.  Discussed case with Dr. Hilma Favors.  Went to visit patient at bedside -no family/visitors present. Patient was lying in bed -he does not respond to voice/gentle touch. Signs and non-verbal gestures of discomfort noted. Patient is found with severe air hunger and increased work of breathing. Discussed symptom management plan with attending at bedside.   Called pharmacist personally to discuss case - requested orders be verified as soon as possible for patient's comfort.   Notified RN of orders and symptom management plan.  Primary Decision  Maker: Medical providers working in patient's best interest    SUMMARY OF RECOMMENDATIONS   Continue full comfort measures Continue DNR/DNI as previously documented Patient not stable for transfer to residential hospice facility - anticipate hospital death Added orders for EOL symptom management and to reflect full comfort measures, as well as discontinued orders that were not focused on comfort Unrestricted visitation orders were placed per current [facility] EOL visitation policy  Nursing to provide frequent assessments and administer PRN medications as clinically necessary to ensure EOL comfort PMT will continue to follow and support holistically  Symptom Management Continuous dilaudid infusion; PRN bolus doses for breakthrough symptoms pain/dyspnea/increased work of breathing/RR>25 Tylenol PRN pain/fever Biotin twice daily Benadryl PRN itching Robinul PRN secretions Haldol PRN agitation/delirium Valium PRN agitation Zofran PRN nausea/vomiting  Code Status/Advance Care Planning: DNR  Palliative Prophylaxis:  Aspiration, Bowel Regimen, Delirium Protocol, Eye Care, Frequent Pain Assessment, Oral Care, and Turn Reposition  Additional Recommendations (Limitations, Scope, Preferences): Full Comfort Care  Psycho-social/Spiritual:  Created space and opportunity for patient to express thoughts and feelings regarding patient's current medical situation.   Prognosis:  Hours  Discharge Planning: Anticipated Hospital Death      Primary Diagnoses: Present on Admission:  Acute on chronic renal failure ([place])   I have reviewed the medical record, interviewed the patient and family, and examined the patient. The following aspects are pertinent.  Past Medical History:  Diagnosis Date   CKD (chronic kidney disease) stage 3, GFR 30-59 ml/min (HCC)    Depression    Diabetes mellitus without complication ([place])    Hypertension    Social History   Socioeconomic History    Marital status: Legally Separated    Spouse name: Not on file   Number of children: Not  on file   Years of education: Not on file   Highest education level: Not on file  Occupational History   Not on file  Tobacco Use   Smoking status: Every Day    Packs/day: 0.50    Types: Cigarettes   Smokeless tobacco: Never  Substance and Sexual Activity   Alcohol use: Yes   Drug use: No   Sexual activity: Not on file  Other Topics Concern   Not on file  Social History Narrative   Not on file   Social Determinants of Health   Financial Resource Strain: Not on file  Food Insecurity: Not on file  Transportation Needs: Not on file  Physical Activity: Not on file  Stress: Not on file  Social Connections: Not on file   No family history on file. Scheduled Meds:  antiseptic oral rinse  15 mL Topical BID   HYDROmorphone       Continuous Infusions:  HYDROmorphone Stopped (07/26/22 1451)   PRN Meds:.diazepam, diphenhydrAMINE, glycopyrrolate, haloperidol **OR** haloperidol **OR** haloperidol lactate, HYDROmorphone, HYDROmorphone, ondansetron **OR** ondansetron (ZOFRAN) IV, polyvinyl alcohol Medications Prior to Admission:  Prior to Admission medications   Medication Sig Start Date End Date Taking? Authorizing Provider  acetaminophen (TYLENOL) 325 MG tablet Take 650 mg by mouth every 6 (six) hours as needed for pain or fever. 07/16/18  Yes [provider]  amLODipine (NORVASC) 5 MG tablet Take 7.5 mg by mouth daily.   Yes [provider]  calcitRIOL (ROCALTROL) 0.5 MCG capsule Take 0.5 mcg by mouth daily.   Yes [provider]  LANTUS SOLOSTAR 100 UNIT/ML Solostar Pen Inject 32 Units into the skin at bedtime. 06/13/22  Yes [provider]  linagliptin (TRADJENTA) 5 MG TABS tablet Take 5 mg by mouth daily.   Yes [provider]  Morphine Sulfate (MORPHINE CONCENTRATE) 10 mg / 0.5 ml concentrated solution Take 0.25 mLs by mouth every 4 (four) hours  as needed for shortness of breath. 03/08/22  Yes [provider]  ondansetron (ZOFRAN) 4 MG tablet Take 4 mg by mouth 2 (two) times daily as needed for nausea/vomiting. 05/15/22  Yes [provider]  polyethylene glycol (MIRALAX / GLYCOLAX) 17 g packet Take 17 g by mouth 2 (two) times a week. Tues, Friday   Yes [provider]  simvastatin (ZOCOR) 10 MG tablet Take 10 mg by mouth at bedtime. 04/14/20  Yes [provider]  sodium bicarbonate 650 MG tablet Take 650 mg by mouth 2 (two) times daily. 06/11/22  Yes [provider]  aspirin EC 81 MG tablet Take 81 mg by mouth daily. Patient not taking: Reported on 07/26/2022    [provider]  cloNIDine (CATAPRES) 0.1 MG tablet Take 0.1 mg by mouth every 8 (eight) hours as needed. Patient not taking: Reported on 07/26/22    [provider]  docusate sodium (COLACE) 100 MG capsule Take 100 mg by mouth 2 (two) times daily. Patient not taking: Reported on 07/26/22    [provider]  famotidine (PEPCID) 20 MG tablet Take 20 mg by mouth daily. Patient not taking: Reported on 07-26-2022 05/09/22   [provider]  ferrous sulfate 325 (65 FE) MG EC tablet Take 325 mg by mouth daily. Patient not taking: Reported on 07/26/22    [provider]  glipiZIDE (GLUCOTROL XL) 5 MG 24 hr tablet Take 5 mg by mouth daily with breakfast. Patient not taking: Reported on 07/26/22    [provider]  losartan (  COZAAR) 25 MG tablet Take 25 mg by mouth daily. Patient not taking: Reported on 07-23-22    [provider]  Omega-3 Fatty Acids (FISH OIL) 1000 MG CAPS Take 1,000 mg by mouth daily. Patient not taking: Reported on 07-23-22    [provider]   Allergies  Allergen Reactions   Penicillins Other (See Comments)    Not listed on MAR   Review of Systems  Unable to perform ROS: Acuity of condition    Physical Exam Vitals and nursing note reviewed.   Constitutional:      General: He is not in acute distress.    Appearance: He is ill-appearing.  Pulmonary:     Effort: Tachypnea and respiratory distress present.  Skin:    General: Skin is warm and dry.  Neurological:     Mental Status: He is unresponsive.     Motor: Weakness present.  Psychiatric:        Speech: He is noncommunicative.     Vital Signs: BP (!) 112/43   Pulse 100   Temp 98 F (36.7 C)   Resp (!) 24   Ht 5\' 11"  (1.803 m)   Wt 90.7 kg   SpO2 (!) 86%   BMI 27.89 kg/m  Pain Scale: FLACC       SpO2: SpO2: (!) 86 % O2 Device:SpO2: (!) 86 % O2 Flow Rate: .   IO: Intake/output summary:  Intake/Output Summary (Last 24 hours) at July 23, 2022 1658 Last data filed at July 23, 2022 1451 Gross per 24 hour  Intake 2514.47 ml  Output --  Net 2514.47 ml    LBM:   Baseline Weight: Weight: 90.7 kg Most recent weight: Weight: 90.7 kg     Palliative Assessment/Data: PPS 10%     Time In: 1300 Time Out: 1405 Time Total: 65 minutes  Greater than 50%  of this time was spent counseling and coordinating care related to the above assessment and plan.  Signed by: Lin Landsman, NP   Please contact Palliative Medicine Team phone at [phone] for questions and concerns.  For individual provider: See Amion  *Portions of this note are a verbal dictation therefore any spelling and/or grammatical errors are due to the "[facility] [employer]" system interpretation.

## 2022-07-15 NOTE — ED Provider Notes (Signed)
[facility] EMERGENCY DEPARTMENT Provider Note   CSN: [id number] Arrival date & time: 07-08-2022  4098     History  Chief Complaint  Patient presents with   Altered Mental Status    Jose Conway is a 61 y.o. male.  Patient presents via EMS from SNF with altered mental status. Pt not verbally responsive - level 5 caveat. No report of fever. No report of fall.  EMS notes bp 206/ (although states agitated/tense/moving at time of measurement, CBG 165, sats 100%, hr 90..   The history is provided by the patient, medical records and the EMS personnel. The history is limited by the condition of the patient.  Altered Mental Status      Home Medications Prior to Admission medications   Medication Sig Start Date End Date Taking? Authorizing Provider  amLODipine (NORVASC) 10 MG tablet Take 1 tablet (10 mg total) by mouth every morning. For control of high blood pressure 02/25/12   Darrol Jump, MD  aspirin EC 81 MG tablet Take 81 mg by mouth daily.    [provider]  calcitRIOL (ROCALTROL) 0.5 MCG capsule Take 0.5 mcg by mouth daily.    [provider]  cloNIDine (CATAPRES) 0.1 MG tablet Take 0.1 mg by mouth every 8 (eight) hours as needed.    [provider]  docusate sodium (COLACE) 100 MG capsule Take 100 mg by mouth 2 (two) times daily.    [provider]  ferrous sulfate 325 (65 FE) MG EC tablet Take 325 mg by mouth daily.    [provider]  glipiZIDE (GLUCOTROL XL) 5 MG 24 hr tablet Take 5 mg by mouth daily with breakfast.    [provider]  linagliptin (TRADJENTA) 5 MG TABS tablet Take 5 mg by mouth daily.    [provider]  losartan (COZAAR) 25 MG tablet Take 25 mg by mouth daily.    [provider]  Omega-3 Fatty Acids (FISH OIL) 1000 MG CAPS Take 1,000 mg by mouth daily.    [provider]  simvastatin (ZOCOR) 10 MG tablet Take 10 mg by mouth at bedtime. 04/14/20   [provider]      Allergies    Penicillins    Review of Systems   Review of Systems  Unable to perform ROS: Mental status change    Physical Exam Updated Vital Signs BP (!) 251/73 (BP Location: Right Arm)   Pulse (!) 101   Temp (!) 93.9 F (34.4 C) (Rectal)   Resp (!) 29   SpO2 100%  Physical Exam Vitals and nursing note reviewed.  Constitutional:      Appearance: Normal appearance. He is well-developed.  HENT:     Head: Atraumatic.     Nose: Nose normal.     Mouth/Throat:     Mouth: Mucous membranes are moist.     Pharynx: Oropharynx is clear.  Eyes:     General: No scleral icterus.    Conjunctiva/sclera: Conjunctivae normal.     Pupils: Pupils are equal, round, and reactive to light.  Neck:     Trachea: No tracheal deviation.     Comments: No neck stiffness or rigidity. Trachea midline. Thyroid not grossly enlarged or tender.  Cardiovascular:     Rate and Rhythm: Normal rate and regular rhythm.     Pulses: Normal pulses.     Heart sounds: Normal heart sounds. No murmur heard.    No friction rub. No gallop.  Pulmonary:  Effort: Pulmonary effort is normal. No accessory muscle usage or respiratory distress.     Breath sounds: Normal breath sounds.  Abdominal:     General: Bowel sounds are normal. There is no distension.     Palpations: Abdomen is soft.     Tenderness: There is no abdominal tenderness.  Genitourinary:    Comments: No cva tenderness. Musculoskeletal:        General: No swelling or tenderness.     Cervical back: Normal range of motion and neck supple. No rigidity.  Skin:    General: Skin is warm and dry.     Findings: No rash.  Neurological:     Mental Status: He is alert.     Comments: Alert appearing. Eyes open. Moving bil extremities w good strength. Not following commands, mildly agitated.   Psychiatric:     Comments: Markedly altered.      ED Results / Procedures / Treatments   Labs (all labs ordered are listed, but only  abnormal results are displayed) Results for orders placed or performed during the hospital encounter of July 04, 2022  Lactic acid, plasma  Result Value Ref Range   Lactic Acid, Venous 4.4 (HH) 0.5 - 1.9 mmol/L  Comprehensive metabolic panel  Result Value Ref Range   Sodium 143 135 - 145 mmol/L   Potassium 6.3 (HH) 3.5 - 5.1 mmol/L   Chloride 103 98 - 111 mmol/L   CO2 <7 (L) 22 - 32 mmol/L   Glucose, Bld 165 (H) 70 - 99 mg/dL   BUN 223 (H) 6 - 20 mg/dL   Creatinine, Ser 26.61 (H) 0.61 - 1.24 mg/dL   Calcium 6.7 (L) 8.9 - 10.3 mg/dL   Total Protein 8.5 (H) 6.5 - 8.1 g/dL   Albumin 3.3 (L) 3.5 - 5.0 g/dL   AST 42 (H) 15 - 41 U/L   ALT 25 0 - 44 U/L   Alkaline Phosphatase 110 38 - 126 U/L   Total Bilirubin 0.6 0.3 - 1.2 mg/dL   GFR, Estimated 2 (L) >60 mL/min   Anion gap PENDING 5 - 15  CBC with Differential  Result Value Ref Range   WBC 33.3 (H) 4.0 - 10.5 K/uL   RBC 3.92 (L) 4.22 - 5.81 MIL/uL   Hemoglobin 10.2 (L) 13.0 - 17.0 g/dL   HCT 30.7 (L) 39.0 - 52.0 %   MCV 78.3 (L) 80.0 - 100.0 fL   MCH 26.0 26.0 - 34.0 pg   MCHC 33.2 30.0 - 36.0 g/dL   RDW 15.1 11.5 - 15.5 %   Platelets 316 150 - 400 K/uL   nRBC 0.9 (H) 0.0 - 0.2 %   Neutrophils Relative % 81 %   Neutro Abs 26.8 (H) 1.7 - 7.7 K/uL   Lymphocytes Relative 3 %   Lymphs Abs 0.9 0.7 - 4.0 K/uL   Monocytes Relative 3 %   Monocytes Absolute 1.1 (H) 0.1 - 1.0 K/uL   Eosinophils Relative 0 %   Eosinophils Absolute 0.1 0.0 - 0.5 K/uL   Basophils Relative 1 %   Basophils Absolute 0.3 (H) 0.0 - 0.1 K/uL   WBC Morphology See Note    Immature Granulocytes 12 %   Abs Immature Granulocytes 4.12 (H) 0.00 - 0.07 K/uL  CBG monitoring, ED  Result Value Ref Range   Glucose-Capillary 153 (H) 70 - 99 mg/dL  Troponin I (High Sensitivity)  Result Value Ref Range   Troponin I (High Sensitivity) 306 (HH) <18 ng/L  EKG EKG Interpretation  Date/Time:  Jul 07, 2022 06:46:30 EDT Ventricular Rate:  95 PR  Interval:  226 QRS Duration: 136 QT Interval:  408 QTC Calculation: 513 R Axis:   91 Text Interpretation: Sinus rhythm Prolonged PR interval Probable left ventricular hypertrophy Non-specific ST-t changes Prolonged QT interval `prominent/peaked t waves v3-v4 Confirmed by Lajean Saver [id number]) on Jul 07, 2022 7:13:47 AM  Radiology CT Head Wo Contrast  Result Date: July 07, 2022 CLINICAL DATA:  Altered mental status. EXAM: CT HEAD WITHOUT CONTRAST TECHNIQUE: Contiguous axial images were obtained from the base of the skull through the vertex without intravenous contrast. RADIATION DOSE REDUCTION: This exam was performed according to the departmental dose-optimization program which includes automated exposure control, adjustment of the mA and/or kV according to patient size and/or use of iterative reconstruction technique. COMPARISON:  CT head dated May 12, 2020. FINDINGS: Brain: No evidence of acute infarction, hemorrhage, hydrocephalus, extra-axial collection or mass lesion/mass effect. Stable atrophy and chronic microvascular ischemic changes. 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 Vascular: Atherosclerotic vascular calcification of the carotid siphons. No hyperdense vessel. Skull: Normal. Negative for fracture or focal lesion. Sinuses/Orbits: No acute finding. Other: None. IMPRESSION: 1. No acute intracranial abnormality. Electronically Signed   By: Titus Dubin M.D.   On: 07/07/22 08:50   DG Chest Port 1 View  Result Date: 07/07/2022 CLINICAL DATA:  61 year old male with possible sepsis. EXAM: PORTABLE CHEST 1 VIEW COMPARISON:  Chest radiographs 07/11/2018 and earlier. FINDINGS: Portable AP supine view at 0733 hours. Chronic borderline to mild cardiomegaly. Other mediastinal contours are within normal limits. Lower lung volumes. Allowing for portable technique the lungs are clear. Visualized tracheal air column is within normal limits. No pneumothorax or pleural effusion is evident. No acute  osseous abnormality identified. Paucity of bowel gas in the visible abdomen. IMPRESSION: No acute cardiopulmonary abnormality. Electronically Signed   By: Genevie Ann M.D.   On: July 07, 2022 08:12    Procedures Procedures    Medications Ordered in ED Medications - No data to display  ED Course/ Medical Decision Making/ A&P                           Medical Decision Making Problems Addressed: Acute alteration in mental status: acute illness or injury with systemic symptoms that poses a threat to life or bodily functions AKI (acute kidney injury) ([place]): acute illness or injury with systemic symptoms that poses a threat to life or bodily functions Dehydration: acute illness or injury with systemic symptoms that poses a threat to life or bodily functions Elevated blood pressure reading: acute illness or injury Elevated BUN: acute illness or injury with systemic symptoms that poses a threat to life or bodily functions Elevated lactic acid level: acute illness or injury with systemic symptoms that poses a threat to life or bodily functions Hyperkalemia: acute illness or injury with systemic symptoms that poses a threat to life or bodily functions Hypothermia, initial encounter: acute illness or injury with systemic symptoms that poses a threat to life or bodily functions Leukocytosis, unspecified type: acute illness or injury Uremia: acute illness or injury with systemic symptoms that poses a threat to life or bodily functions  Amount and/or Complexity of Data Reviewed Independent Historian: EMS    Details: EMS/facility, hx External Data Reviewed: labs and notes. Labs: ordered. Decision-making details documented in ED Course. Radiology: ordered and independent interpretation performed. Decision-making details documented in ED Course. ECG/medicine tests: ordered and independent interpretation  performed. Decision-making details documented in ED Course. Discussion of management or test  interpretation with external provider(s): Icu/cc, renal - discussed pt, labs,   Risk Prescription drug management. Decision regarding hospitalization.  Critical Care Total time providing critical care: 155 minutes   Iv ns. Continuous pulse ox and cardiac monitoring. Labs ordered/sent. Imaging ordered.   Reviewed nursing notes and prior charts for additional history. External reports reviewed. Additional history from: EMS and facility.  Cbg 153. Istat 8. Cultures sent. CT.  Cardiac monitor: sinus rhythm, rate 90.  Labs reviewed/interpreted by me - k high, bun extremely high, cr v high. Lactate very high.  30 cc/kg LR bolus, iv abx. Retail banker. Bladder scan/foley.   Xrays reviewed/interpreted by me - no pna.   CT reviewed/interpreted by me - no hem.  Critical care consulted for admission. Discussed pt - will see/admit.   Nephrology consulted re possible emergent dialysis. Discussed pt and labs - will see.   On review additional records - pt with prior hx ckd IV, but also noted in 02/2022 to have been sent to ED further eval re: abnormal labs, cr 8 - pt had refused additional care then.  Pt also noted with palliative care note 11/2021, not overally helpful in terms of goals of care discussion and documentation but  pt was noted to be 'full code' then.   Multiple rechecks. Mental status appears c/w baseline. No obvious source infection identified. Ua pending. Delta trop pending.   CRITICAL CARE Performed by: Mirna Mires Total critical care time: 155 minutes Critical care time was exclusive of separately billable procedures and treating other patients. Critical care was necessary to treat or prevent imminent or life-threatening deterioration. Critical care was time spent personally by me on the following activities: development of treatment plan with patient and/or surrogate as well as nursing, discussions with consultants, evaluation of patient's response to treatment,  examination of patient, obtaining history from patient or surrogate, ordering and performing treatments and interventions, ordering and review of laboratory studies, ordering and review of radiographic studies, pulse oximetry and re-evaluation of patient's condition.          Final Clinical Impression(s) / ED Diagnoses Final diagnoses:  None    Rx / DC Orders ED Discharge Orders     None         Lajean Saver, MD July 14, 2022 [phone]

## 2022-07-15 NOTE — ED Notes (Signed)
Saline gauze placed over pt eyes. Pt donation status unknown at this time.[facility] update pending.

## 2022-07-15 NOTE — ED Notes (Signed)
CBG 153 

## 2022-07-15 NOTE — ED Notes (Signed)
Pt work of breathing labored. Pt Dilaudid drip started and bolus given. Pt mouth suctioned.

## 2022-07-15 NOTE — ED Notes (Signed)
Pt respirations even and less labored.

## 2022-07-15 NOTE — Progress Notes (Signed)
RT called to room to assess patient. Pt on side with snoring respirations and not appropriate. RT informed MD

## 2022-07-15 NOTE — Progress Notes (Signed)
RT called critical results from ABG to PA and stated he would alert MD of results

## 2022-07-15 NOTE — ED Notes (Signed)
Critical care team at bedside

## 2022-07-15 NOTE — IPAL (Signed)
  Interdisciplinary Goals of Care Family Meeting   Date carried out: July 05, 2022  Location of the meeting: Phone conference  Member's involved: Physician  Durable Power of Attorney or acting medical decision maker: None, no next of kin, no contacts per residence at [place]: We discussed goals of care for [name] .  Patient presents to the ED encephalopathic.  Does not have capacity.  There is no next of kin or contact information listed in his chart via EMR other than [facility], the facility where he lives.  Myself and Dr. Osborne Casco contacted this facility and asked for contact information of the next of kin.  Per discussion with the supervisor, there is no next of kin or contact information listed in his chart or paperwork at the facility.  He was reportedly homeless prior to coming to live there.  He has no family or next of kin to contact.  He has acute on chronic renal failure.  He has refused dialysis in the past.  He presents with florid renal failure and uremia as well as possible severe sepsis.  His initial pH is 6.7 on arterial blood gas with a PCO2 of 16.  I cannot ventilate him any better that with machines.  Intubation would certainly lead to worsening pH and death.  Given this is all chronic renal failure driving a lot of his acidemia and he is not dialysis candidate, additional care is futile.  Discussed with Dr. Osborne Casco, nephrologist.  We both agree and instituting a DNR.  I recommend palliative care and comfort measures.  With this severe acidemia, he is likely to die imminently.  Code status: Full DNR  Disposition: Admission per [facility], strongly recommended recommend comfort measures, palliative care consultation at the discretion of admitting provider  Time spent for the meeting: n/a    [name], MD  07/05/2022, 10:31 AM

## 2022-07-15 NOTE — Progress Notes (Addendum)
Brief nephrology note  Patient was brought to the hospital today because of altered mental status.  It appears the patient has a history of hypertension and diabetes.  His creatinine was 8.9 in May 2023.  Labs today demonstrate markedly elevated potassium as well as creatinine of 26.6.  I discussed the patient's case with his facility.  They will obtain the patient's emergency contact but have tried calling emergency contact with no success thus far.  The patient may not have strong family contact based on their information.  When the patient was seen in May he left refusing care.  The patient's facility states that he has refused to see nephrologist in the past and has also refused dialysis when it is offered.  Patient does not have capacity to make those decisions at this time but based on his wishes when he did have capacity we would continue to follow those wishes and not offer dialysis at this time.  This has been discussed with palliative care.  Can provide some scope of supportive care.  We are needed in the future we can help further.  ADDENDUM: no next of kin per his facility. Discussed with case with Dr. Silas Flood and given extensive illness and history of desire for minimal interventions I agree that we should minimize efforts at this time and focus more directly on his comfort. I agree that he should be DNR/DNI. Consider morphine infusion to help alleviate suffering and provide comfort.

## 2022-07-15 NOTE — ED Notes (Signed)
[facility] L[facility] [place]. Updated facility of pt time of death.

## 2022-07-15 NOTE — Death Summary Note (Signed)
  Name: Jewett Mcgann MRN: [id number] DOB: 1961-07-20 61 y.o.  Date of Admission: 2022-07-14  6:26 AM Date of Discharge: 07/14/22 Attending Physician: Axel Filler, *  Discharge Diagnosis: Principal Problem:   Acute on chronic renal failure ([place]) Acute metabolic encephalopathy Uremic encephalopathy Acute hypoxic respiratory failure Severe hypertension Lactic acidosis Severe metabolic acidosis Hyperkalemia  Cause of death: Acute on chronic renal failure Time of death: 1451  Disposition and follow-up:   Mr.Tudor Worrel was discharged from [facility] in expired condition.    Hospital Course: Mr. Bjorkman is a 61 year old male with history of hypertension, CKD 4 and diabetes presented to [facility] ED by EMS from [place] and rehab for evaluation of altered mental status. On admission, he was found to have acute on chronic renal failure with creatinine of 26 and BUN of 223. This was further complicated by severe metabolic acidosis, lactic acidosis, severely elevated blood pressures and potassium of 6.3. Intensivist was consulted due to worsening mental status as well as respiratory status. Mechanical ventilation was considered however, due to his renal failure being the main cause of his acidemia, this was considered futile care.  Based on previous discussion with Nephrology, patient did not want dialysis and thus was deemed not a dialysis candidate to respect his wishes. Since patient was altered and did not have any capacity, his nursing facility was contacted for information for his next of kin. Providers were informed that patient does not have any next of kin or contact information in his chart.  At this point, since medical therapy was considered futile, both nephrologist and intensivist agreed that it was appropriate to change his CODE STATUS to DNR and transition him to comfort care. The palliative care team was consulted to assist with comfort care  orders and internal medicine was consulted for admission and monitoring for hospital death. Comfort care orders were placed and patient expired peacefully at 1451. Postmortem care was provided and patient was transported to the morgue.  Signed: Lacinda Axon, MD 07/14/2022, 4:16 PM

## 2022-07-15 NOTE — ED Notes (Signed)
Waiting pharmacy to verify medications. Pt monitoring d/c. Pt pulled up on stretcher and pillow placed under pt head. Blanket provided.

## 2022-07-15 NOTE — ED Notes (Signed)
Talked to Dr. Hilma Favors. Pt comfort measures at this time.

## 2022-07-15 NOTE — ED Notes (Signed)
Pt not breathing at this time. Rande Brunt at bedside. Time of Death confirmed. ED provider aware.

## 2022-07-15 DEATH — deceased
